# Patient Record
Sex: Female | Born: 1965 | Race: Black or African American | Hispanic: No | Marital: Single | Smoking: Never smoker
Health system: Southern US, Community
[De-identification: ages and names within clinical notes are randomized; demographics above are authoritative.]

## PROBLEM LIST (undated history)

## (undated) DIAGNOSIS — F32A Depression, unspecified: Secondary | ICD-10-CM

## (undated) DIAGNOSIS — F419 Anxiety disorder, unspecified: Secondary | ICD-10-CM

## (undated) DIAGNOSIS — E039 Hypothyroidism, unspecified: Secondary | ICD-10-CM

## (undated) DIAGNOSIS — J45909 Unspecified asthma, uncomplicated: Secondary | ICD-10-CM

## (undated) DIAGNOSIS — R112 Nausea with vomiting, unspecified: Secondary | ICD-10-CM

## (undated) DIAGNOSIS — Z8614 Personal history of Methicillin resistant Staphylococcus aureus infection: Secondary | ICD-10-CM

## (undated) DIAGNOSIS — I10 Essential (primary) hypertension: Secondary | ICD-10-CM

## (undated) DIAGNOSIS — F329 Major depressive disorder, single episode, unspecified: Secondary | ICD-10-CM

## (undated) DIAGNOSIS — I499 Cardiac arrhythmia, unspecified: Secondary | ICD-10-CM

## (undated) DIAGNOSIS — R0600 Dyspnea, unspecified: Secondary | ICD-10-CM

## (undated) DIAGNOSIS — M549 Dorsalgia, unspecified: Secondary | ICD-10-CM

## (undated) DIAGNOSIS — Z9889 Other specified postprocedural states: Secondary | ICD-10-CM

## (undated) HISTORY — PX: TUBAL LIGATION: SHX77

## (undated) HISTORY — PX: CYSTECTOMY: SUR359

## (undated) HISTORY — PX: HERNIA REPAIR: SHX51

---

## 2000-01-21 ENCOUNTER — Other Ambulatory Visit: Admission: RE | Admit: 2000-01-21 | Discharge: 2000-01-21 | Payer: Self-pay | Admitting: Obstetrics

## 2000-04-19 ENCOUNTER — Emergency Department (HOSPITAL_COMMUNITY): Admission: EM | Admit: 2000-04-19 | Discharge: 2000-04-19 | Payer: Self-pay | Admitting: Emergency Medicine

## 2000-08-12 ENCOUNTER — Inpatient Hospital Stay (HOSPITAL_COMMUNITY): Admission: AD | Admit: 2000-08-12 | Discharge: 2000-08-12 | Payer: Self-pay | Admitting: Obstetrics

## 2001-08-04 ENCOUNTER — Emergency Department (HOSPITAL_COMMUNITY): Admission: EM | Admit: 2001-08-04 | Discharge: 2001-08-04 | Payer: Self-pay | Admitting: Emergency Medicine

## 2001-08-10 ENCOUNTER — Encounter: Payer: Self-pay | Admitting: *Deleted

## 2001-08-10 ENCOUNTER — Encounter: Admission: RE | Admit: 2001-08-10 | Discharge: 2001-08-10 | Payer: Self-pay | Admitting: *Deleted

## 2005-03-30 ENCOUNTER — Emergency Department (HOSPITAL_COMMUNITY): Admission: EM | Admit: 2005-03-30 | Discharge: 2005-03-30 | Payer: Self-pay | Admitting: Family Medicine

## 2006-01-06 ENCOUNTER — Emergency Department (HOSPITAL_COMMUNITY): Admission: EM | Admit: 2006-01-06 | Discharge: 2006-01-06 | Payer: Self-pay | Admitting: Family Medicine

## 2006-12-29 ENCOUNTER — Encounter: Admission: RE | Admit: 2006-12-29 | Discharge: 2006-12-29 | Payer: Self-pay | Admitting: Family Medicine

## 2008-02-01 ENCOUNTER — Encounter: Admission: RE | Admit: 2008-02-01 | Discharge: 2008-02-01 | Payer: Self-pay | Admitting: Diagnostic Radiology

## 2008-06-28 ENCOUNTER — Ambulatory Visit: Payer: Self-pay | Admitting: Internal Medicine

## 2008-06-28 DIAGNOSIS — L910 Hypertrophic scar: Secondary | ICD-10-CM

## 2008-07-30 ENCOUNTER — Emergency Department (HOSPITAL_COMMUNITY): Admission: EM | Admit: 2008-07-30 | Discharge: 2008-07-30 | Payer: Self-pay | Admitting: Family Medicine

## 2008-08-06 ENCOUNTER — Emergency Department (HOSPITAL_COMMUNITY): Admission: EM | Admit: 2008-08-06 | Discharge: 2008-08-06 | Payer: Self-pay | Admitting: Family Medicine

## 2008-08-17 ENCOUNTER — Ambulatory Visit: Payer: Self-pay | Admitting: Internal Medicine

## 2008-08-17 ENCOUNTER — Other Ambulatory Visit: Admission: RE | Admit: 2008-08-17 | Discharge: 2008-08-17 | Payer: Self-pay | Admitting: Internal Medicine

## 2008-08-17 ENCOUNTER — Encounter (INDEPENDENT_AMBULATORY_CARE_PROVIDER_SITE_OTHER): Payer: Self-pay | Admitting: Internal Medicine

## 2008-08-17 DIAGNOSIS — F319 Bipolar disorder, unspecified: Secondary | ICD-10-CM

## 2008-08-17 DIAGNOSIS — L03119 Cellulitis of unspecified part of limb: Secondary | ICD-10-CM

## 2008-08-17 DIAGNOSIS — L02419 Cutaneous abscess of limb, unspecified: Secondary | ICD-10-CM | POA: Insufficient documentation

## 2008-08-17 DIAGNOSIS — E039 Hypothyroidism, unspecified: Secondary | ICD-10-CM | POA: Insufficient documentation

## 2008-08-17 LAB — CONVERTED CEMR LAB
Bilirubin Urine: NEGATIVE
KOH Prep: NEGATIVE
Ketones, urine, test strip: NEGATIVE
Specific Gravity, Urine: 1.015
Whiff Test: NEGATIVE
pH: 6

## 2008-08-23 ENCOUNTER — Ambulatory Visit: Payer: Self-pay | Admitting: Internal Medicine

## 2008-08-29 DIAGNOSIS — D649 Anemia, unspecified: Secondary | ICD-10-CM

## 2008-08-29 LAB — CONVERTED CEMR LAB
AST: 13 units/L (ref 0–37)
Albumin: 4.1 g/dL (ref 3.5–5.2)
Alkaline Phosphatase: 86 units/L (ref 39–117)
BUN: 14 mg/dL (ref 6–23)
Basophils Relative: 0 % (ref 0–1)
Calcium: 9.5 mg/dL (ref 8.4–10.5)
Chlamydia, DNA Probe: NEGATIVE
Chloride: 103 meq/L (ref 96–112)
HDL: 43 mg/dL (ref >39)
LDL Cholesterol: 76 mg/dL (ref 0–99)
Lymphs Abs: 2.7 10*3/uL (ref 0.7–4.0)
MCHC: 32.7 g/dL (ref 30.0–36.0)
Monocytes Relative: 6 % (ref 3–12)
Neutro Abs: 6 10*3/uL (ref 1.7–7.7)
Neutrophils Relative %: 64 % (ref 43–77)
Platelets: 399 10*3/uL (ref 150–400)
Potassium: 4.6 meq/L (ref 3.5–5.3)
RBC: 4.79 M/uL (ref 3.87–5.11)
Sodium: 141 meq/L (ref 135–145)
TSH: 1.126 microintl units/mL (ref 0.350–4.50)
Total Protein: 8 g/dL (ref 6.0–8.3)
WBC: 9.4 10*3/uL (ref 4.0–10.5)

## 2008-09-06 ENCOUNTER — Encounter: Payer: Self-pay | Admitting: Obstetrics & Gynecology

## 2008-09-06 ENCOUNTER — Ambulatory Visit (HOSPITAL_COMMUNITY): Admission: RE | Admit: 2008-09-06 | Discharge: 2008-09-06 | Payer: Self-pay | Admitting: Obstetrics & Gynecology

## 2008-09-07 ENCOUNTER — Encounter (INDEPENDENT_AMBULATORY_CARE_PROVIDER_SITE_OTHER): Payer: Self-pay | Admitting: *Deleted

## 2009-08-08 ENCOUNTER — Encounter: Admission: RE | Admit: 2009-08-08 | Discharge: 2009-08-08 | Payer: Self-pay | Admitting: Family Medicine

## 2009-09-06 ENCOUNTER — Emergency Department (HOSPITAL_COMMUNITY): Admission: EM | Admit: 2009-09-06 | Discharge: 2009-09-06 | Payer: Self-pay | Admitting: Emergency Medicine

## 2009-09-08 ENCOUNTER — Encounter: Payer: Self-pay | Admitting: Pulmonary Disease

## 2009-12-02 ENCOUNTER — Ambulatory Visit: Payer: Self-pay | Admitting: Pulmonary Disease

## 2009-12-02 DIAGNOSIS — R0602 Shortness of breath: Secondary | ICD-10-CM

## 2011-01-12 ENCOUNTER — Encounter: Payer: Self-pay | Admitting: Family Medicine

## 2011-03-05 ENCOUNTER — Other Ambulatory Visit: Payer: Self-pay | Admitting: Family Medicine

## 2011-03-05 DIAGNOSIS — Z1231 Encounter for screening mammogram for malignant neoplasm of breast: Secondary | ICD-10-CM

## 2011-03-24 ENCOUNTER — Emergency Department (HOSPITAL_COMMUNITY): Payer: Medicare Other

## 2011-03-24 ENCOUNTER — Emergency Department (HOSPITAL_COMMUNITY)
Admission: EM | Admit: 2011-03-24 | Discharge: 2011-03-24 | Disposition: A | Discharge status: Home / Self Care | Payer: Medicare Other | Attending: Emergency Medicine | Admitting: Emergency Medicine

## 2011-03-24 DIAGNOSIS — M1710 Unilateral primary osteoarthritis, unspecified knee: Secondary | ICD-10-CM | POA: Insufficient documentation

## 2011-03-24 DIAGNOSIS — M25569 Pain in unspecified knee: Secondary | ICD-10-CM | POA: Insufficient documentation

## 2011-03-24 DIAGNOSIS — IMO0002 Reserved for concepts with insufficient information to code with codable children: Secondary | ICD-10-CM | POA: Insufficient documentation

## 2011-03-27 LAB — DIFFERENTIAL
Eosinophils Absolute: 0.1 10*3/uL (ref 0.0–0.7)
Eosinophils Relative: 1 % (ref 0–5)
Lymphocytes Relative: 27 % (ref 12–46)
Lymphs Abs: 3.7 10*3/uL (ref 0.7–4.0)
Monocytes Relative: 5 % (ref 3–12)

## 2011-03-27 LAB — POCT I-STAT, CHEM 8
BUN: 18 mg/dL (ref 6–23)
Creatinine, Ser: 1 mg/dL (ref 0.4–1.2)
Glucose, Bld: 119 mg/dL — ABNORMAL HIGH (ref 70–99)
Hemoglobin: 12.9 g/dL (ref 12.0–15.0)
Sodium: 138 mEq/L (ref 135–145)
TCO2: 27 mmol/L (ref 0–100)

## 2011-03-27 LAB — CBC
HCT: 35.9 % — ABNORMAL LOW (ref 36.0–46.0)
Hemoglobin: 11.6 g/dL — ABNORMAL LOW (ref 12.0–15.0)
MCV: 76.7 fL — ABNORMAL LOW (ref 78.0–100.0)
RBC: 4.68 MIL/uL (ref 3.87–5.11)
WBC: 13.7 10*3/uL — ABNORMAL HIGH (ref 4.0–10.5)

## 2011-03-27 LAB — BASIC METABOLIC PANEL
BUN: 15 mg/dL (ref 6–23)
Chloride: 102 mEq/L (ref 96–112)
GFR calc Af Amer: >60 mL/min (ref >60)
GFR calc non Af Amer: 55 mL/min — ABNORMAL LOW (ref >60)
Potassium: 4.1 mEq/L (ref 3.5–5.1)
Sodium: 138 mEq/L (ref 135–145)

## 2011-04-15 ENCOUNTER — Ambulatory Visit: Payer: Self-pay

## 2011-04-30 ENCOUNTER — Ambulatory Visit: Payer: Self-pay

## 2011-05-05 NOTE — Op Note (Signed)
Suzanne Jacobs, Suzanne Jacobs               ACCOUNT NO.:  [id number]   MEDICAL RECORD NO.:  [id number]          PATIENT TYPE:  AMB   LOCATION:  [facility]                           [facility]ACILITY:  [facility]   PHYSICIAN:  Roseanna Rainbow, M.D.DATE OF BIRTH:  [birth year]   DATE OF PROCEDURE:  DATE OF DISCHARGE:                               OPERATIVE REPORT   PREOPERATIVE DIAGNOSIS:  Keloid of the vulva.   POSTOPERATIVE DIAGNOSIS:  Keloid of the vulva.   PROCEDURE:  Section of a keloid of the vulva.   SURGEON:  Roseanna Rainbow, MD   ANESTHESIA:  Laryngeal mask airway, local.   FINDINGS:  There was a 2 cm x 1 cm hypertrophic scar in the midline of  the mons pubis skin.   ESTIMATED BLOOD LOSS:  Minimal.   COMPLICATIONS:  None.   PROCEDURE:  The patient was taken to the operating room with an IV  running.  A laryngeal mask airway was placed.  She was placed in the  dorsal lithotomy position and prepped and draped in the usual sterile  fashion.  After time-out had been completed, the skin surrounding the  scar was incised and the keloid was excised using the Bovie on a cutting  setting.  Individual bleeding points in the subcutaneous tissue were  cauterized with Bovie.  The defect was then irrigated.  The incision was  then repaired in layers using interrupted sutures of 3-0 Vicryl in the  subcutaneous layers.  The skin was closed in a subcuticular fashion  again using running suture of 3-0 Vicryl.  Dermabond was applied.  Please note that prior to the closure of the defect, was infiltrated  with a Kenalog 0.25% Marcaine mixture.  At the close of the procedure,  the instrument and pack counts were said to be correct x2.  The patient  was awakened from anesthesia and taken to the PACU, awake and in stable  condition.      Roseanna Rainbow, M.D.  Electronically Signed     LAJ/MEDQ  D:  09/06/2008  T:  09/06/2008  Job:  [id number]

## 2011-05-20 ENCOUNTER — Encounter (HOSPITAL_COMMUNITY): Payer: Medicare Other

## 2011-05-20 ENCOUNTER — Other Ambulatory Visit: Payer: Self-pay | Admitting: Obstetrics & Gynecology

## 2011-05-20 LAB — BASIC METABOLIC PANEL
BUN: 14 mg/dL (ref 6–23)
CO2: 24 mEq/L (ref 19–32)
Calcium: 9.7 mg/dL (ref 8.4–10.5)
Chloride: 99 mEq/L (ref 96–112)
Creatinine, Ser: 0.89 mg/dL (ref 0.4–1.2)
GFR calc Af Amer: >60 mL/min (ref >60)

## 2011-05-20 LAB — CBC
Hemoglobin: 11.3 g/dL — ABNORMAL LOW (ref 12.0–15.0)
MCH: 24.2 pg — ABNORMAL LOW (ref 26.0–34.0)
MCHC: 33.4 g/dL (ref 30.0–36.0)
MCV: 72.4 fL — ABNORMAL LOW (ref 78.0–100.0)
Platelets: 260 10*3/uL (ref 150–400)
RBC: 4.67 MIL/uL (ref 3.87–5.11)

## 2011-05-22 ENCOUNTER — Ambulatory Visit (HOSPITAL_COMMUNITY)
Admission: RE | Admit: 2011-05-22 | Discharge: 2011-05-22 | Disposition: A | Discharge status: Home / Self Care | Payer: Medicare Other | Source: Ambulatory Visit | Attending: Obstetrics & Gynecology | Admitting: Obstetrics & Gynecology

## 2011-05-22 ENCOUNTER — Other Ambulatory Visit: Payer: Self-pay | Admitting: Obstetrics & Gynecology

## 2011-05-22 DIAGNOSIS — Z01812 Encounter for preprocedural laboratory examination: Secondary | ICD-10-CM | POA: Insufficient documentation

## 2011-05-22 DIAGNOSIS — L910 Hypertrophic scar: Secondary | ICD-10-CM | POA: Insufficient documentation

## 2011-05-22 DIAGNOSIS — Z01818 Encounter for other preprocedural examination: Secondary | ICD-10-CM | POA: Insufficient documentation

## 2011-05-29 NOTE — Op Note (Signed)
  NAMESILAS, Suzanne Jacobs               ACCOUNT NO.:  [id number]  MEDICAL RECORD NO.:  [id number]           PATIENT TYPE:  O  LOCATION:  [facility]                          [facility]  PHYSICIAN:  Roseanna Rainbow, M.D.DATE OF BIRTH:  January 26, 1966  DATE OF PROCEDURE:  05/22/2011 DATE OF DISCHARGE:                              OPERATIVE REPORT   PREOPERATIVE DIAGNOSIS:  Keloid of the vulva, mons pubis skin.  POSTOPERATIVE DIAGNOSIS:  Keloid of the vulva, mons pubis skin.  PROCEDURE:  Excision of keloid from the mons pubis.  SURGEON:  Roseanna Rainbow, MD  ANESTHESIA:  Local, laryngeal mask airway.  FINDINGS:  Keloid approximately 2 cm in diameter involving the skin of the mons pubis in the midline.  SPECIMEN:  Hypertrophic scar, keloid.  ESTIMATED BLOOD LOSS:  Minimal.  COMPLICATIONS:  None.  PROCEDURE:  The patient was taken to the operating room with an IV running.  A laryngeal mask airway was placed.  She was then placed in the semilithotomy position in Hatch stirrups and prepped and draped in the usual sterile fashion.  After time-out had been completed, the above- noted lesion was circumscribed with the scalpel.  The scar was then excised using the Bovie.  The defect was then reapproximated in several layers.  Two deeper running layers of 2-0 Vicryl suture were placed in the subcutaneous fat layer.  The subcuticular layer and the skin was reapproximated with vertical mattress sutures using 3-0 Vicryl.  Please note that prior to the incision, the scar was infiltrated with 10 mL of 1% lidocaine.  Prior to the closure, the subcutaneous layer was infiltrated with Kenalog, Marcaine mixture.  The incision was irrigated. Adequate hemostasis was noted.  At the close of the procedure, the instrument and pack counts were said to be correct x2.  The patient was taken to the PACU awake and in stable condition.     Roseanna Rainbow, M.D.    Judee Clara  D:  05/22/2011   T:  05/23/2011  Job:  [id number]  Electronically Signed by Antionette Char M.D. on 05/29/2011 01:24:06 PM

## 2011-07-16 ENCOUNTER — Ambulatory Visit
Admission: RE | Admit: 2011-07-16 | Discharge: 2011-07-16 | Disposition: A | Discharge status: Home / Self Care | Payer: Medicare Other | Source: Ambulatory Visit | Attending: Family Medicine | Admitting: Family Medicine

## 2011-07-16 DIAGNOSIS — Z1231 Encounter for screening mammogram for malignant neoplasm of breast: Secondary | ICD-10-CM

## 2011-09-18 LAB — CBC
HCT: 35.2 — ABNORMAL LOW
Hemoglobin: 11.3 — ABNORMAL LOW
MCHC: 32.1
Platelets: 351
RDW: 17.4 — ABNORMAL HIGH

## 2011-09-18 LAB — CULTURE, ROUTINE-ABSCESS

## 2011-09-20 ENCOUNTER — Emergency Department (HOSPITAL_COMMUNITY): Payer: Medicare Other

## 2011-09-20 ENCOUNTER — Emergency Department (HOSPITAL_COMMUNITY)
Admission: EM | Admit: 2011-09-20 | Discharge: 2011-09-20 | Disposition: A | Discharge status: Home / Self Care | Payer: Medicare Other | Attending: Emergency Medicine | Admitting: Emergency Medicine

## 2011-09-20 DIAGNOSIS — I10 Essential (primary) hypertension: Secondary | ICD-10-CM | POA: Insufficient documentation

## 2011-09-20 DIAGNOSIS — Z79899 Other long term (current) drug therapy: Secondary | ICD-10-CM | POA: Insufficient documentation

## 2011-09-20 DIAGNOSIS — K219 Gastro-esophageal reflux disease without esophagitis: Secondary | ICD-10-CM | POA: Insufficient documentation

## 2011-09-20 DIAGNOSIS — R05 Cough: Secondary | ICD-10-CM | POA: Insufficient documentation

## 2011-09-20 DIAGNOSIS — F319 Bipolar disorder, unspecified: Secondary | ICD-10-CM | POA: Insufficient documentation

## 2011-09-20 DIAGNOSIS — J309 Allergic rhinitis, unspecified: Secondary | ICD-10-CM | POA: Insufficient documentation

## 2011-09-20 DIAGNOSIS — F79 Unspecified intellectual disabilities: Secondary | ICD-10-CM | POA: Insufficient documentation

## 2011-09-20 DIAGNOSIS — R509 Fever, unspecified: Secondary | ICD-10-CM | POA: Insufficient documentation

## 2011-09-20 DIAGNOSIS — E039 Hypothyroidism, unspecified: Secondary | ICD-10-CM | POA: Insufficient documentation

## 2011-09-20 DIAGNOSIS — R059 Cough, unspecified: Secondary | ICD-10-CM | POA: Insufficient documentation

## 2011-09-21 LAB — CBC
HCT: 37.3
Hemoglobin: 11.9 — ABNORMAL LOW
RBC: 4.95
WBC: 13.7 — ABNORMAL HIGH

## 2012-02-24 ENCOUNTER — Encounter (HOSPITAL_COMMUNITY): Payer: Self-pay | Admitting: *Deleted

## 2012-02-24 ENCOUNTER — Emergency Department (HOSPITAL_COMMUNITY)
Admission: EM | Admit: 2012-02-24 | Discharge: 2012-02-24 | Disposition: A | Discharge status: Home / Self Care | Payer: Medicare Other | Attending: Emergency Medicine | Admitting: Emergency Medicine

## 2012-02-24 DIAGNOSIS — R109 Unspecified abdominal pain: Secondary | ICD-10-CM | POA: Insufficient documentation

## 2012-02-24 DIAGNOSIS — L293 Anogenital pruritus, unspecified: Secondary | ICD-10-CM | POA: Insufficient documentation

## 2012-02-24 DIAGNOSIS — I10 Essential (primary) hypertension: Secondary | ICD-10-CM | POA: Insufficient documentation

## 2012-02-24 DIAGNOSIS — N390 Urinary tract infection, site not specified: Secondary | ICD-10-CM | POA: Insufficient documentation

## 2012-02-24 DIAGNOSIS — R300 Dysuria: Secondary | ICD-10-CM | POA: Insufficient documentation

## 2012-02-24 HISTORY — DX: Essential (primary) hypertension: I10

## 2012-02-24 LAB — WET PREP, GENITAL
Clue Cells Wet Prep HPF POC: NONE SEEN
Trich, Wet Prep: NONE SEEN
Yeast Wet Prep HPF POC: NONE SEEN

## 2012-02-24 LAB — PREGNANCY, URINE: Preg Test, Ur: NEGATIVE

## 2012-02-24 LAB — URINALYSIS, ROUTINE W REFLEX MICROSCOPIC
Glucose, UA: NEGATIVE mg/dL
Protein, ur: NEGATIVE mg/dL
Specific Gravity, Urine: 1.016 (ref 1.005–1.030)
pH: 6 (ref 5.0–8.0)

## 2012-02-24 LAB — POCT PREGNANCY, URINE: Preg Test, Ur: NEGATIVE

## 2012-02-24 MED ORDER — CEPHALEXIN 500 MG PO CAPS: 500.0000 mg | ORAL_CAPSULE | Freq: Three times a day (TID) | ORAL | Status: AC

## 2012-02-24 NOTE — ED Provider Notes (Signed)
Medical screening examination/treatment/procedure(s) were performed by non-physician practitioner and as supervising physician I was immediately available for consultation/collaboration.   Glynn Octave, MD 02/24/12 [phone]

## 2012-02-24 NOTE — Discharge Instructions (Signed)
Urinary Tract Infection Infections of the urinary tract can start in several places. A bladder infection (cystitis), a kidney infection (pyelonephritis), and a prostate infection (prostatitis) are different types of urinary tract infections (UTIs). They usually get better if treated with medicines (antibiotics) that kill germs. Take all the medicine until it is gone. You or your child may feel better in a few days, but TAKE ALL MEDICINE or the infection may not respond and may become more difficult to treat. HOME CARE INSTRUCTIONS   Drink enough water and fluids to keep the urine clear or pale yellow. Cranberry juice is especially recommended, in addition to large amounts of water.   Avoid caffeine, tea, and carbonated beverages. They tend to irritate the bladder.   Alcohol may irritate the prostate.   Only take over-the-counter or prescription medicines for pain, discomfort, or fever as directed by your caregiver.  To prevent further infections:  Empty the bladder often. Avoid holding urine for long periods of time.   After a bowel movement, women should cleanse from front to back. Use each tissue only once.   Empty the bladder before and after sexual intercourse.  FINDING OUT THE RESULTS OF YOUR TEST Not all test results are available during your visit. If your or your child's test results are not back during the visit, make an appointment with your caregiver to find out the results. Do not assume everything is normal if you have not heard from your caregiver or the medical facility. It is important for you to follow up on all test results. SEEK MEDICAL CARE IF:   There is back pain.   Your baby is older than 3 months with a rectal temperature of 100.5 F (38.1 C) or higher for more than 1 day.   Your or your child's problems (symptoms) are no better in 3 days. Return sooner if you or your child is getting worse.  SEEK IMMEDIATE MEDICAL CARE IF:   There is severe back pain or lower  abdominal pain.   You or your child develops chills.   You have a fever.   Your baby is older than 3 months with a rectal temperature of 102 F (38.9 C) or higher.   Your baby is 86 months old or younger with a rectal temperature of 100.4 F (38 C) or higher.   There is nausea or vomiting.   There is continued burning or discomfort with urination.  MAKE SURE YOU:   Understand these instructions.   Will watch your condition.   Will get help right away if you are not doing well or get worse.  Document Released: 09/16/2005 Document Revised: 11/26/2011 Document Reviewed: 04/21/2007 [facility] Patient Information 2012 [place], [place].       If you have no primary doctor, here are some resources that may be helpful:  Medicaid-accepting [facility] Providers:   - [facility]- [address]      [phone]      Mon-Fri 9am-7pm, Sat 9am-1pm   - [facility]- [address] [place]      [phone]   - [facility]- [address], [address] [place]      [phone]   [facility]- [address]      [phone]   - Renaye Rakers- [address] [place], [address]      [phone]      Only accepts [place] patients  after they have her name applied to their card   Self Pay (no insurance) in [facility]:   - Sickle Cell Patients: Dr Willey Blade, [facility]      [address] [place]      [phone]   - [facility] [phone]   - Physician Referral Service- [phone]   - [facility]- [address] [place]      [phone]   [facility] [place]- [address]   - [facility]- see information above      (Speak to [employer] if you do not have insurance)   - [facility]- [address] [place]      [phone]   - [facility] [place]- [address]      [phone]   - [facility]- [address]      [phone]   - Dr Julio Sicks-  [address], [place]      [phone]   - [facility]- [address]      [phone]   - [facility]- [address]      [phone]      Also [address]      [phone]   - [facility]- [address]      [phone]      1st and 3rd Saturday every month, 10am-1pm Other agencies that provide inexpensive medical care:    [facility]  [phone]    [facility]  [phone]    [facility]  [phone]    [facility]  [phone]    [facility]  [phone]  General Information: Finding a doctor when you do not have health insurance can be tricky. Although you are not limited by an insurance plan, you are of course limited by her finances and how much but he can pay out of pocket.  What are your options if you don't have health insurance?   1) Find a Librarian, academic and Pay Out of Pocket Although you won't have to find out who is covered by your insurance plan, it is a good idea to ask around and get recommendations. You will then need to call the office and see if the doctor you have chosen will accept you as a new patient and what types of options they offer for patients who are self-pay. Some doctors offer discounts or will set up payment plans for their patients who do not have insurance, but you will need to ask so you aren't surprised when you get to your appointment.  2) Contact Your Local Health Department Not all health departments have doctors that can see patients for sick visits, but many do, so it is worth a call to see if yours does. If you don't know where your local health department is, you can check in your phone book. The CDC also has a tool to help you locate your state's health department, and many state websites also have listings of all of their local health departments.  3) Find a Walk-in Clinic If your illness is not likely to be  very severe or complicated, you may want to try a walk in clinic. These are popping up all over the country in pharmacies, drugstores, and shopping centers. They're usually staffed by nurse practitioners or physician assistants that have been trained to treat  common illnesses and complaints. They're usually fairly quick and inexpensive. However, if you have serious medical issues or chronic medical problems, these are probably not your best option

## 2012-02-24 NOTE — ED Provider Notes (Signed)
History     CSN: [id number]  Arrival date & time 02/24/12  1623   First MD Initiated Contact with Patient 02/24/12 1713      Chief Complaint  Patient presents with  . Dysuria  . Abdominal Pain    (Consider location/radiation/quality/duration/timing/severity/associated sxs/prior treatment) The history is provided by the patient.  46 y/o F with hx HTN presents to ED with c/c of dysuria and lower abd pain x several days. Also with c/o vulvar itching in same timeframe. Abd pain is crampy, located in mid and bilateral lower abd, intermittent, mild, non-radiating. There is no assoc fever, chills, N/V/D/constipation, hematuria, vaginal discharge or pain. Denies sexual activity. Nothing makes the symptoms better or worse. No prior tx.  Past Medical History  Diagnosis Date  . Hypertension     Past Surgical History  Procedure Date  . Hernia repair     No family history on file.  History  Substance Use Topics  . Smoking status: Never Smoker   . Smokeless tobacco: Not on file  . Alcohol Use: No     Review of Systems 10 systems reviewed and are negative for acute change except as noted in the HPI.  Allergies  Sulfamethoxazole w/trimethoprim  Home Medications  No current outpatient prescriptions on file.  BP 105/73  Pulse 91  Temp(Src) 98 F (36.7 C) (Oral)  Resp 18  Wt 270 lb 12.8 oz (122.834 kg)  SpO2 100%  LMP 01/23/2012  Physical Exam  Constitutional: She is oriented to person, place, and time. She appears well-developed and well-nourished. No distress.  HENT:  Head: Normocephalic and atraumatic.  Mouth/Throat: Oropharynx is clear and moist.  Eyes: Pupils are equal, round, and reactive to light.  Neck: Normal range of motion. Neck supple.  Cardiovascular: Normal rate and regular rhythm.   Pulmonary/Chest: Effort normal. No respiratory distress.  Abdominal: Soft. Bowel sounds are normal. She exhibits no distension. There is Tenderness: mild suprapubic and  bilateral lower abd tenderness.. There is no rebound and no guarding.       Negative CVA tenderness  Genitourinary: There is no rash, tenderness or lesion on the right labia. There is no rash, tenderness or lesion on the left labia. Cervix exhibits no motion tenderness, no discharge and no friability. Right adnexum displays no mass and no tenderness. Left adnexum displays no mass and no tenderness. No tenderness or bleeding around the vagina. No signs of injury around the vagina. Vaginal discharge: small amt clear/white discharge.       Examination limited by pt body habitus  Musculoskeletal: She exhibits no edema and no tenderness.  Neurological: She is alert and oriented to person, place, and time.  Skin: Skin is warm and dry. No rash noted.    ED Course  Procedures (including critical care time)  Labs Reviewed  URINALYSIS, ROUTINE W REFLEX MICROSCOPIC - Abnormal; Notable for the following:    Leukocytes, UA LARGE (*)    All other components within normal limits  WET PREP, GENITAL - Abnormal; Notable for the following:    WBC, Wet Prep HPF POC FEW (*)    All other components within normal limits  URINE MICROSCOPIC-ADD ON - Abnormal; Notable for the following:    Bacteria, UA MANY (*)    All other components within normal limits  PREGNANCY, URINE  POCT PREGNANCY, URINE  GC/CHLAMYDIA PROBE AMP, GENITAL   No results found.  Dx 1: UTI   MDM  U/a with evidence of UTI. Doubt pyelo- pt afebrile and  non-toxic appearing, no CVA tenderness. No sig vaginal discharge or CMT to suggest PID. Abd benign on re-exam. Will d.c home.       Shaaron Adler, [place] 02/24/12 1911

## 2012-02-24 NOTE — ED Notes (Signed)
Pt states "been having burning & itching when peeing, also having some belly pain"; pt indicates left side abd pain

## 2012-06-22 ENCOUNTER — Encounter (HOSPITAL_COMMUNITY): Payer: Self-pay | Admitting: Emergency Medicine

## 2012-06-22 ENCOUNTER — Emergency Department (HOSPITAL_COMMUNITY)
Admission: EM | Admit: 2012-06-22 | Discharge: 2012-06-22 | Disposition: A | Discharge status: Home / Self Care | Payer: No Typology Code available for payment source | Attending: Emergency Medicine | Admitting: Emergency Medicine

## 2012-06-22 ENCOUNTER — Emergency Department (HOSPITAL_COMMUNITY): Payer: No Typology Code available for payment source

## 2012-06-22 DIAGNOSIS — I10 Essential (primary) hypertension: Secondary | ICD-10-CM | POA: Insufficient documentation

## 2012-06-22 DIAGNOSIS — M25562 Pain in left knee: Secondary | ICD-10-CM

## 2012-06-22 DIAGNOSIS — M25569 Pain in unspecified knee: Secondary | ICD-10-CM | POA: Insufficient documentation

## 2012-06-22 MED ORDER — HYDROCODONE-ACETAMINOPHEN 5-325 MG PO TABS: 1.0000 | ORAL_TABLET | ORAL | Status: AC | PRN

## 2012-06-22 MED ORDER — HYDROCODONE-ACETAMINOPHEN 5-325 MG PO TABS
1.0000 | ORAL_TABLET | Freq: Once | ORAL | Status: AC
  Administered 2012-06-22: 1 via ORAL
  Filled 2012-06-22: qty 1

## 2012-06-22 NOTE — ED Provider Notes (Signed)
Medical screening examination/treatment/procedure(s) were performed by non-physician practitioner and as supervising physician I was immediately available for consultation/collaboration.    Mae Denunzio D Jenavieve Freda, MD 06/22/12 2356 

## 2012-06-22 NOTE — ED Notes (Signed)
Pt fell today at [employer]  Pt states there was liquid dish soap on the floor causing her to slip and fall  Pt is c/o pain to her left knee  Pt has bruising and swelling noted Pt states it hurts to walk on

## 2012-06-22 NOTE — ED Provider Notes (Signed)
History     CSN: [id number]  Arrival date & time 06/22/12  6578   First MD Initiated Contact with Patient 06/22/12 1948      Chief Complaint  Patient presents with  . Fall    (Consider location/radiation/quality/duration/timing/severity/associated sxs/prior treatment) HPI Comments: Patient here with left knee pain s/p slipping on wet dish soap on the floor at [facility] - she states that she went down onto her left knee - reports has been unable to ambulate since the event but is able to pivot and put a small amount of weight on the area - denies numbness, tingling.  Patient is a 46 y.o. female presenting with fall. The history is provided by the patient. No language interpreter was used.  Fall The accident occurred 1 to 2 hours ago. The fall occurred while walking. She fell from a height of 3 to 5 ft. She landed on a hard floor. There was no blood loss. The point of impact was the left knee. The pain is present in the left knee. The pain is at a severity of 10/10. The pain is severe. She was not ambulatory at the scene. There was no entrapment after the fall. There was no drug use involved in the accident. There was no alcohol use involved in the accident. Pertinent negatives include no fever, no abdominal pain, no nausea, no vomiting and no headaches. The symptoms are aggravated by activity, standing and ambulation. She has tried nothing for the symptoms. The treatment provided no relief.    Past Medical History  Diagnosis Date  . Hypertension     Past Surgical History  Procedure Date  . Hernia repair     Family History  Problem Relation Age of Onset  . Cancer Other     History  Substance Use Topics  . Smoking status: Never Smoker   . Smokeless tobacco: Not on file  . Alcohol Use: No    OB History    Grav Para Term Preterm Abortions TAB SAB Ect Mult Living                  Review of Systems  Constitutional: Negative for fever and chills.  HENT: Negative for  neck pain.   Eyes: Negative for pain.  Respiratory: Negative for chest tightness and shortness of breath.   Cardiovascular: Negative for chest pain.  Gastrointestinal: Negative for nausea, vomiting and abdominal pain.  Genitourinary: Negative for dysuria.  Musculoskeletal: Positive for joint swelling, arthralgias and gait problem.  Skin: Negative for wound.  Neurological: Negative for headaches.  All other systems reviewed and are negative.    Allergies  Sulfamethoxazole w-trimethoprim  Home Medications  No current outpatient prescriptions on file.  BP 135/71  Pulse 86  Temp 98 F (36.7 C) (Oral)  Resp 18  SpO2 99%  LMP 06/22/2012  Physical Exam  Nursing note and vitals reviewed. Constitutional: She is oriented to person, place, and time. She appears well-developed and well-nourished. No distress.  HENT:  Head: Normocephalic and atraumatic.  Right Ear: External ear normal.  Left Ear: External ear normal.  Nose: Nose normal.  Mouth/Throat: Oropharynx is clear and moist. No oropharyngeal exudate.  Eyes: Conjunctivae are normal. Pupils are equal, round, and reactive to light. No scleral icterus.  Neck: Normal range of motion. Neck supple.  Cardiovascular: Normal rate, regular rhythm and normal heart sounds.  Exam reveals no gallop and no friction rub.   No murmur heard. Pulmonary/Chest: Effort normal and breath sounds normal. No  respiratory distress. She has no wheezes. She has no rales. She exhibits no tenderness.  Abdominal: Soft. Bowel sounds are normal. She exhibits no distension. There is no tenderness.  Musculoskeletal:       Left knee: She exhibits decreased range of motion, swelling and ecchymosis. She exhibits normal alignment, no LCL laxity and no MCL laxity. tenderness found. Medial joint line, lateral joint line and patellar tendon tenderness noted.  Lymphadenopathy:    She has no cervical adenopathy.  Neurological: She is alert and oriented to person, place,  and time. No cranial nerve deficit. She exhibits normal muscle tone. Coordination normal.  Skin: Skin is warm and dry. No rash noted. No erythema. No pallor.  Psychiatric: She has a normal mood and affect. Her behavior is normal. Judgment and thought content normal.    ED Course  Procedures (including critical care time)  Labs Reviewed - No data to display Dg Knee Complete 4 Views Left  06/22/2012  *RADIOLOGY REPORT*  Clinical Data: Fall, anterior knee pain.  LEFT KNEE - COMPLETE 4+ VIEW  Comparison: 03/24/2011  Findings: Moderate degenerative changes in the left knee, most pronounced in the medial and patellofemoral compartments. No acute bony abnormality.  Specifically, no fracture, subluxation, or dislocation.  Soft tissues are intact.  No joint effusion.  IMPRESSION: No acute bony abnormality.  Original Report Authenticated By: Cyndie Chime, M.D.     Left knee pain   MDM  Patient here with left knee pain s/p fall - x-ray without acute findings, patient is morbidly obese so will place in ace wrap - and pain medication - will follow up with Dr. Luiz Blare.        Suzanne Jacobs, [place] 06/22/12 2131

## 2012-08-02 ENCOUNTER — Emergency Department (HOSPITAL_COMMUNITY)
Admission: EM | Admit: 2012-08-02 | Discharge: 2012-08-02 | Disposition: A | Discharge status: Home / Self Care | Payer: No Typology Code available for payment source | Attending: Emergency Medicine | Admitting: Emergency Medicine

## 2012-08-02 DIAGNOSIS — I10 Essential (primary) hypertension: Secondary | ICD-10-CM | POA: Insufficient documentation

## 2012-08-02 DIAGNOSIS — S8990XA Unspecified injury of unspecified lower leg, initial encounter: Secondary | ICD-10-CM | POA: Insufficient documentation

## 2012-08-02 DIAGNOSIS — W010XXA Fall on same level from slipping, tripping and stumbling without subsequent striking against object, initial encounter: Secondary | ICD-10-CM | POA: Insufficient documentation

## 2012-08-02 DIAGNOSIS — S8992XA Unspecified injury of left lower leg, initial encounter: Secondary | ICD-10-CM

## 2012-08-02 MED ORDER — OXYCODONE-ACETAMINOPHEN 5-325 MG PO TABS
2.0000 | ORAL_TABLET | Freq: Once | ORAL | Status: AC
  Administered 2012-08-02: 2 via ORAL
  Filled 2012-08-02: qty 2

## 2012-08-02 NOTE — ED Provider Notes (Signed)
Medical screening examination/treatment/procedure(s) were performed by non-physician practitioner and as supervising physician I was immediately available for consultation/collaboration.  Kada Friesen T Carden Teel, MD 08/02/12 2342 

## 2012-08-02 NOTE — ED Provider Notes (Signed)
History     CSN: [id number]  Arrival date & time 08/02/12  1840   First MD Initiated Contact with Patient 08/02/12 1928      Chief Complaint  Patient presents with  . Knee Pain    (Consider location/radiation/quality/duration/timing/severity/associated sxs/prior treatment) HPI  45 year old female who reportedly fell at a store when slipping on wet floor.  Pt was seen in ER for evaluation at that time.  Has xray of L knee that shows no acute fx or dislocation.  Was given hydrocodone for pain and referral to orthopedic, Dr. Luiz Blare.  Pt did f/u at [facility].  Pt was given etodolac 400mg  for pain.  Pt sts she has been taking her hydrocodone for pain which helps but her medication has ran out and now the pain return.  Has not taking Etodolac as prescribed.  Pt has f/u appointment in 2 weeks but decided to come here due to worsening of L knee and ankle pain.  Described pain as sharp sensation worsen with walking and improves with rest, non radiating.  No fever, rash, or bleeding.   Past Medical History  Diagnosis Date  . Hypertension     Past Surgical History  Procedure Date  . Hernia repair     Family History  Problem Relation Age of Onset  . Cancer Other     History  Substance Use Topics  . Smoking status: Never Smoker   . Smokeless tobacco: Not on file  . Alcohol Use: No    OB History    Grav Para Term Preterm Abortions TAB SAB Ect Mult Living                  Review of Systems  All other systems reviewed and are negative.    Allergies  Sulfamethoxazole w-trimethoprim  Home Medications  No current outpatient prescriptions on file.  BP 103/77  Pulse 89  Temp 98.9 F (37.2 C) (Oral)  Resp 17  SpO2 98%  Physical Exam  Nursing note and vitals reviewed. Constitutional: She is oriented to person, place, and time. She appears well-developed and well-nourished. No distress.       Morbidly obese  HENT:  Head: Atraumatic.  Eyes: Conjunctivae are  normal.  Neck: Neck supple.  Musculoskeletal:       L lower extremity with generalized ttp throughout without deformity, or swelling noted.  L knee with decreased ROM due to pain.  L ankle with normal ROM.  Pedal pulse 2+.    Neurological: She is alert and oriented to person, place, and time.  Skin: Skin is warm. No rash noted.  Psychiatric: She has a normal mood and affect.    ED Course  Procedures (including critical care time)  Labs Reviewed - No data to display No results found.    1. L knee injury  MDM  L knee injury 1 month ago.  No significant finding on today's exam.  Pt has been prescribed pain medication through her orthopedic office and also has f/u appointment.  Plan to give pt pain management here.  Recommend continue Etodolac as previously prescribed.  Refer to ortho.  Pt voice understanding and agrees with plan.    BP 103/77  Pulse 89  Temp 98.9 F (37.2 C) (Oral)  Resp 17  SpO2 98%       Fayrene Helper, PA-C 08/02/12 2003

## 2012-08-02 NOTE — ED Notes (Signed)
Pt fell in the store on 7/3 and has been having pain in her L leg ever since. Pt c/o pain in L knee and ankle. Pt has been taking Hydrocodone for pain. Pt in wheelchair at triage.

## 2012-08-08 ENCOUNTER — Other Ambulatory Visit (HOSPITAL_COMMUNITY): Payer: Self-pay | Admitting: Sports Medicine

## 2012-08-08 ENCOUNTER — Other Ambulatory Visit: Payer: Self-pay | Admitting: Sports Medicine

## 2012-08-08 DIAGNOSIS — M79606 Pain in leg, unspecified: Secondary | ICD-10-CM

## 2012-08-08 DIAGNOSIS — M545 Low back pain: Secondary | ICD-10-CM

## 2012-08-12 ENCOUNTER — Encounter (HOSPITAL_COMMUNITY): Payer: Medicare Other

## 2012-08-12 ENCOUNTER — Other Ambulatory Visit (HOSPITAL_COMMUNITY): Payer: Medicare Other

## 2012-08-18 ENCOUNTER — Ambulatory Visit
Admission: RE | Admit: 2012-08-18 | Discharge: 2012-08-18 | Disposition: A | Discharge status: Home / Self Care | Payer: Medicare Other | Source: Ambulatory Visit | Attending: Sports Medicine | Admitting: Sports Medicine

## 2012-08-18 DIAGNOSIS — M545 Low back pain: Secondary | ICD-10-CM

## 2012-09-06 ENCOUNTER — Other Ambulatory Visit (HOSPITAL_COMMUNITY): Payer: Medicare Other

## 2012-09-06 ENCOUNTER — Encounter (HOSPITAL_COMMUNITY): Payer: Medicare Other

## 2012-09-20 ENCOUNTER — Encounter (HOSPITAL_COMMUNITY): Payer: Medicare Other

## 2012-09-29 ENCOUNTER — Encounter (HOSPITAL_COMMUNITY): Payer: Medicare Other

## 2012-10-07 ENCOUNTER — Encounter (HOSPITAL_COMMUNITY)
Admission: RE | Admit: 2012-10-07 | Discharge: 2012-10-07 | Disposition: A | Discharge status: Home / Self Care | Payer: Medicare Other | Source: Ambulatory Visit | Attending: Sports Medicine | Admitting: Sports Medicine

## 2012-10-07 ENCOUNTER — Encounter (HOSPITAL_COMMUNITY): Payer: Self-pay

## 2012-10-07 DIAGNOSIS — M79606 Pain in leg, unspecified: Secondary | ICD-10-CM

## 2012-10-07 DIAGNOSIS — M79609 Pain in unspecified limb: Secondary | ICD-10-CM | POA: Insufficient documentation

## 2012-10-07 MED ORDER — TECHNETIUM TC 99M MEDRONATE IV KIT
25.0000 | PACK | Freq: Once | INTRAVENOUS | Status: AC | PRN
  Administered 2012-10-07: 25 via INTRAVENOUS

## 2012-11-21 ENCOUNTER — Other Ambulatory Visit: Payer: Self-pay | Admitting: Family Medicine

## 2012-11-21 DIAGNOSIS — Z1231 Encounter for screening mammogram for malignant neoplasm of breast: Secondary | ICD-10-CM

## 2012-12-15 ENCOUNTER — Emergency Department (HOSPITAL_COMMUNITY)
Admission: EM | Admit: 2012-12-15 | Discharge: 2012-12-15 | Disposition: A | Discharge status: Home / Self Care | Payer: Medicare Other | Attending: Emergency Medicine | Admitting: Emergency Medicine

## 2012-12-15 ENCOUNTER — Emergency Department (HOSPITAL_COMMUNITY): Payer: Medicare Other

## 2012-12-15 DIAGNOSIS — R079 Chest pain, unspecified: Secondary | ICD-10-CM | POA: Insufficient documentation

## 2012-12-15 DIAGNOSIS — I10 Essential (primary) hypertension: Secondary | ICD-10-CM | POA: Insufficient documentation

## 2012-12-15 DIAGNOSIS — N390 Urinary tract infection, site not specified: Secondary | ICD-10-CM | POA: Insufficient documentation

## 2012-12-15 DIAGNOSIS — Z79899 Other long term (current) drug therapy: Secondary | ICD-10-CM | POA: Insufficient documentation

## 2012-12-15 DIAGNOSIS — F410 Panic disorder [episodic paroxysmal anxiety] without agoraphobia: Secondary | ICD-10-CM | POA: Insufficient documentation

## 2012-12-15 DIAGNOSIS — Z3202 Encounter for pregnancy test, result negative: Secondary | ICD-10-CM | POA: Insufficient documentation

## 2012-12-15 DIAGNOSIS — F430 Acute stress reaction: Secondary | ICD-10-CM | POA: Insufficient documentation

## 2012-12-15 LAB — URINALYSIS, ROUTINE W REFLEX MICROSCOPIC
Glucose, UA: NEGATIVE mg/dL
Hgb urine dipstick: NEGATIVE
Specific Gravity, Urine: 1.008 (ref 1.005–1.030)
Urobilinogen, UA: 0.2 mg/dL (ref 0.0–1.0)

## 2012-12-15 LAB — POCT I-STAT, CHEM 8
BUN: 16 mg/dL (ref 6–23)
Chloride: 102 mEq/L (ref 96–112)
HCT: 37 % (ref 36.0–46.0)
Sodium: 140 mEq/L (ref 135–145)
TCO2: 29 mmol/L (ref 0–100)

## 2012-12-15 LAB — POCT PREGNANCY, URINE: Preg Test, Ur: NEGATIVE

## 2012-12-15 MED ORDER — LORAZEPAM 2 MG/ML IJ SOLN
1.0000 mg | Freq: Once | INTRAMUSCULAR | Status: AC
  Administered 2012-12-15: 1 mg via INTRAVENOUS
  Filled 2012-12-15: qty 1

## 2012-12-15 MED ORDER — NITROFURANTOIN MONOHYD MACRO 100 MG PO CAPS: 100.0000 mg | ORAL_CAPSULE | Freq: Two times a day (BID) | ORAL | Status: DC

## 2012-12-15 MED ORDER — DEXTROSE 5 % IV SOLN
1.0000 g | Freq: Once | INTRAVENOUS | Status: AC
  Administered 2012-12-15: 1 g via INTRAVENOUS
  Filled 2012-12-15: qty 10

## 2012-12-15 MED ORDER — LORAZEPAM 1 MG PO TABS: 1.0000 mg | ORAL_TABLET | Freq: Three times a day (TID) | ORAL | Status: DC | PRN

## 2012-12-15 NOTE — ED Notes (Signed)
[id number]<AV> Expected date:12/15/12<BR> Expected time: 4:31 PM<BR> Means of arrival:<BR> Comments:<BR> Female from home SOB

## 2012-12-15 NOTE — ED Notes (Signed)
Per EMS pt s/s started last night, having difficulty breathing at times with tightness in her chest. Pt accompanied by boyfriend with EMS, [facility].

## 2012-12-15 NOTE — ED Provider Notes (Addendum)
History     CSN: [id number]  Arrival date & time 12/15/12  1633   First MD Initiated Contact with Patient 12/15/12 1657      Chief Complaint  Patient presents with  . Shortness of Breath    (Consider location/radiation/quality/duration/timing/severity/associated sxs/prior treatment) Patient is a 46 y.o. female presenting with shortness of breath. The history is provided by the patient and the spouse.  Shortness of Breath  The current episode started yesterday. The onset was sudden. The problem occurs frequently. The problem has been gradually worsening. The problem is severe. Nothing relieves the symptoms. Exacerbated by: stress. Associated symptoms include chest pain and shortness of breath. Pertinent negatives include no fever, no cough and no wheezing. Associated symptoms comments: Anxious and feels like panic attack that started after she ran out of her psych meds and did not have any yesterday. Her past medical history does not include asthma or past wheezing. The last void occurred less than 6 hours ago. There were no sick contacts. She has received no recent medical care.    Past Medical History  Diagnosis Date  . Hypertension     Past Surgical History  Procedure Date  . Hernia repair     Family History  Problem Relation Age of Onset  . Cancer Other     History  Substance Use Topics  . Smoking status: Never Smoker   . Smokeless tobacco: Not on file  . Alcohol Use: No    OB History    Grav Para Term Preterm Abortions TAB SAB Ect Mult Living                  Review of Systems  Constitutional: Negative for fever and chills.  Respiratory: Positive for shortness of breath. Negative for cough and wheezing.   Cardiovascular: Positive for chest pain.  Psychiatric/Behavioral: The patient is nervous/anxious.        States she feels scared, sob, and feels of doom and anxious  All other systems reviewed and are negative.    Allergies  Sulfamethoxazole  w-trimethoprim  Home Medications   Current Outpatient Rx  Name  Route  Sig  Dispense  Refill  . LEVOTHYROXINE SODIUM 50 MCG PO TABS   Oral   Take 50 mcg by mouth daily.         Marland Kitchen LISINOPRIL-HYDROCHLOROTHIAZIDE 10-12.5 MG PO TABS   Oral   Take 1 tablet by mouth daily.         . QUETIAPINE FUMARATE ER 300 MG PO TB24   Oral   Take 300 mg by mouth at bedtime.         . TRAZODONE HCL 150 MG PO TABS   Oral   Take 150 mg by mouth at bedtime.           BP 132/85  Pulse 88  Temp 97.7 F (36.5 C) (Oral)  Resp 21  SpO2 100%  Physical Exam  Nursing note and vitals reviewed. Constitutional: She is oriented to person, place, and time. She appears well-developed and well-nourished. She appears distressed.       Tearful on exam  HENT:  Head: Normocephalic and atraumatic.  Mouth/Throat: Oropharynx is clear and moist.  Eyes: Conjunctivae normal and EOM are normal. Pupils are equal, round, and reactive to light.  Neck: Normal range of motion. Neck supple.  Cardiovascular: Normal rate, regular rhythm and intact distal pulses.   No murmur heard. Pulmonary/Chest: Effort normal and breath sounds normal. No respiratory distress. She has no  wheezes. She has no rales.  Abdominal: Soft. She exhibits no distension. There is no tenderness. There is no rebound and no guarding.  Musculoskeletal: Normal range of motion. She exhibits no edema and no tenderness.  Neurological: She is alert and oriented to person, place, and time.  Skin: Skin is warm and dry. No rash noted. No erythema.  Psychiatric: Her behavior is normal. Her mood appears anxious.    ED Course  Procedures (including critical care time)  Labs Reviewed  URINALYSIS, ROUTINE W REFLEX MICROSCOPIC - Abnormal; Notable for the following:    APPearance CLOUDY (*)     Nitrite POSITIVE (*)     Leukocytes, UA LARGE (*)     All other components within normal limits  POCT I-STAT, CHEM 8 - Abnormal; Notable for the following:     Glucose, Bld 101 (*)     Calcium, Ion 1.25 (*)     All other components within normal limits  URINE MICROSCOPIC-ADD ON - Abnormal; Notable for the following:    Bacteria, UA MANY (*)     All other components within normal limits  POCT PREGNANCY, URINE  URINE CULTURE   Dg Chest 2 View  12/15/2012  *RADIOLOGY REPORT*  Clinical Data: Chest pain and short of breath  CHEST - 2 VIEW  Comparison: 09/20/2011  Findings: Heart size is normal and the vascularity is normal. Negative for heart failure.  No edema or effusion.  Mild atelectasis in the lung bases.  Negative for pneumonia or mass lesion.  IMPRESSION: Mild bibasilar atelectasis.  No acute abnormality.   Original Report Authenticated By: Janeece Riggers, M.D.      Date: 12/15/2012  Rate: 80  Rhythm: normal sinus rhythm  QRS Axis: normal  Intervals: normal  ST/T Wave abnormalities: normal  Conduction Disutrbances: none  Narrative Interpretation: unremarkable     1. Panic attack   2. UTI (lower urinary tract infection)       MDM   Patient is tearful on exam and states that she feels scared, short or breath, chest pain and anxious. Her husband is at bedside with her and states that she ran out of her medications yesterday and they could not pick them up until today. However she has not taken them today either and she states she feels like she's having a panic attack. Patient has a history of hypertension but has no coronary artery disease or other heart history. She is PERC negative. Feel most likely this is a panic attack. Chest x-ray, EKG, i-STAT pending. Patient given Ativan and will recheck.  6:48 PM  patient feeling better after Ativan labs are within normal limits except for UA that shows a significant UTI. Will treat with antibiotics      Gwyneth Sprout, MD 12/15/12 [place]  Gwyneth Sprout, MD 12/15/12 1610  Gwyneth Sprout, MD 12/15/12 1950

## 2012-12-17 LAB — URINE CULTURE: Colony Count: >=100000

## 2012-12-18 NOTE — ED Notes (Signed)
+  Urine. Patient treated with Macrobid. Sensitive to same. Per protocol MD. °

## 2012-12-27 ENCOUNTER — Ambulatory Visit: Payer: Medicare Other

## 2013-01-26 ENCOUNTER — Ambulatory Visit: Payer: Medicare Other

## 2013-02-14 ENCOUNTER — Other Ambulatory Visit (HOSPITAL_COMMUNITY)
Admission: RE | Admit: 2013-02-14 | Discharge: 2013-02-14 | Disposition: A | Discharge status: Home / Self Care | Payer: Medicare Other | Source: Ambulatory Visit | Attending: Obstetrics and Gynecology | Admitting: Obstetrics and Gynecology

## 2013-02-14 ENCOUNTER — Other Ambulatory Visit: Payer: Self-pay | Admitting: Nurse Practitioner

## 2013-02-14 DIAGNOSIS — Z124 Encounter for screening for malignant neoplasm of cervix: Secondary | ICD-10-CM | POA: Insufficient documentation

## 2013-02-14 DIAGNOSIS — Z1151 Encounter for screening for human papillomavirus (HPV): Secondary | ICD-10-CM | POA: Insufficient documentation

## 2013-02-14 DIAGNOSIS — Z113 Encounter for screening for infections with a predominantly sexual mode of transmission: Secondary | ICD-10-CM | POA: Insufficient documentation

## 2013-02-14 DIAGNOSIS — N760 Acute vaginitis: Secondary | ICD-10-CM | POA: Insufficient documentation

## 2013-03-16 ENCOUNTER — Ambulatory Visit
Admission: RE | Admit: 2013-03-16 | Discharge: 2013-03-16 | Disposition: A | Discharge status: Home / Self Care | Payer: Medicare Other | Source: Ambulatory Visit | Attending: Family Medicine | Admitting: Family Medicine

## 2013-03-16 DIAGNOSIS — Z1231 Encounter for screening mammogram for malignant neoplasm of breast: Secondary | ICD-10-CM

## 2013-04-18 ENCOUNTER — Encounter (HOSPITAL_COMMUNITY): Payer: Self-pay | Admitting: Obstetrics and Gynecology

## 2013-04-18 ENCOUNTER — Inpatient Hospital Stay (HOSPITAL_COMMUNITY)
Admission: AD | Admit: 2013-04-18 | Discharge: 2013-04-18 | Disposition: A | Discharge status: Home / Self Care | Payer: Medicare Other | Source: Ambulatory Visit | Attending: Obstetrics & Gynecology | Admitting: Obstetrics & Gynecology

## 2013-04-18 DIAGNOSIS — N83209 Unspecified ovarian cyst, unspecified side: Secondary | ICD-10-CM | POA: Insufficient documentation

## 2013-04-18 DIAGNOSIS — R102 Pelvic and perineal pain: Secondary | ICD-10-CM

## 2013-04-18 DIAGNOSIS — N949 Unspecified condition associated with female genital organs and menstrual cycle: Secondary | ICD-10-CM

## 2013-04-18 HISTORY — DX: Hypothyroidism, unspecified: E03.9

## 2013-04-18 HISTORY — DX: Anxiety disorder, unspecified: F41.9

## 2013-04-18 LAB — URINALYSIS, ROUTINE W REFLEX MICROSCOPIC
Bilirubin Urine: NEGATIVE
Glucose, UA: NEGATIVE mg/dL
Hgb urine dipstick: NEGATIVE
Ketones, ur: NEGATIVE mg/dL
Protein, ur: NEGATIVE mg/dL

## 2013-04-18 LAB — WET PREP, GENITAL
Trich, Wet Prep: NONE SEEN
Yeast Wet Prep HPF POC: NONE SEEN

## 2013-04-18 MED ORDER — IBUPROFEN 600 MG PO TABS
600.0000 mg | ORAL_TABLET | Freq: Once | ORAL | Status: AC
  Administered 2013-04-18: 600 mg via ORAL
  Filled 2013-04-18: qty 1

## 2013-04-18 MED ORDER — IBUPROFEN 600 MG PO TABS: 600.0000 mg | ORAL_TABLET | Freq: Once | ORAL | Status: DC

## 2013-04-18 NOTE — MAU Note (Signed)
Pain in vaginal area. Seen at [facility] and had [place]. Showed cyst on ovary. Inside of vagina is real hard. Told needed biopsy. Suzanne Jacobs WHNP saw pt at [facility] about 2 wks ago.

## 2013-04-18 NOTE — MAU Provider Note (Signed)
History     CSN: [id number]  Arrival date and time: 04/18/13 1627   First Provider Initiated Contact with Patient 04/18/13 1843      Chief Complaint  Patient presents with  . Vaginal Pain  . Abdominal Cramping   HPI This is a 47 y.o. female who presents with c/o pain in vaginal and pelvic areas.  Has been followed at [facility] for this. Was seen by them and told she has an ovarian cyst and a "hard place " in her vagina that needs a biopsy. Has appt to go back soon. Patient is limited in her cognition  There is a family member with her today, but she is also a poor historian  RN Note: Pain in vaginal area. Seen at [facility] and had [place]. Showed cyst on ovary. Inside of vagina is real hard. Told needed biopsy. AumaThongteum WHNP saw pt at [facility] about 2 wks ago  OB History   Grav Para Term Preterm Abortions TAB SAB Ect Mult Living   1 1        1       Past Medical History  Diagnosis Date  . Hypertension   . Hypothyroidism   . Anxiety     Past Surgical History  Procedure Laterality Date  . Hernia repair    . Tubal ligation      Family History  Problem Relation Age of Onset  . Cancer Other   . Hypertension Mother     History  Substance Use Topics  . Smoking status: Never Smoker   . Smokeless tobacco: Not on file  . Alcohol Use: No    Allergies:  Allergies  Allergen Reactions  . Peanut-Containing Drug Products Itching  . Sulfa Antibiotics Rash  . Sulfamethoxazole W-Trimethoprim Rash    Prescriptions prior to admission  Medication Sig Dispense Refill  . levothyroxine (SYNTHROID, LEVOTHROID) 50 MCG tablet Take 50 mcg by mouth daily.      Marland Kitchen lisinopril-hydrochlorothiazide (PRINZIDE,ZESTORETIC) 10-12.5 MG per tablet Take 1 tablet by mouth daily.      Marland Kitchen LORazepam (ATIVAN) 1 MG tablet Take 1 tablet (1 mg total) by mouth 3 (three) times daily as needed for anxiety.  10 tablet  0  . QUEtiapine (SEROQUEL XR) 300 MG 24 hr tablet Take 300 mg by mouth at bedtime.       . traZODone (DESYREL) 150 MG tablet Take 150 mg by mouth at bedtime.        Review of Systems  Constitutional: Negative for fever, chills and malaise/fatigue.  Gastrointestinal: Positive for abdominal pain. Negative for nausea, vomiting, diarrhea and constipation.  Genitourinary: Negative for dysuria.  Neurological: Negative for weakness and headaches.   Physical Exam   Blood pressure 105/65, temperature 97.4 F (36.3 C), resp. rate 20, height 5\' 7"  (1.702 m), weight 278 lb 3.2 oz (126.191 kg), last menstrual period 04/10/2013, SpO2 98.00%.  Physical Exam  Constitutional: She is oriented to person, place, and time. She appears well-developed and well-nourished. No distress.  HENT:  Head: Normocephalic.  Cardiovascular: Normal rate.   Respiratory: Effort normal.  GI: Soft. She exhibits no distension and no mass. There is tenderness (over suprapubic area). There is no rebound and no guarding.  Genitourinary: Vaginal discharge (milky) found.  Vagina has areas of depigmentation/whitening I don't feel any overt "hardness" or induration though hymenal ring is tight and there is some firmness to anterior rugae.    There is general tenderness to uterus and bilateral adnexae, with no cervical motion  tenderness. Exam is limited by habitus  Musculoskeletal: Normal range of motion.  Neurological: She is alert and oriented to person, place, and time.  Skin: Skin is warm and dry.  Psychiatric: She has a normal mood and affect.   Wet prep negative UA negative  MAU Course  Procedures  Assessment and Plan  A:  Pelvic pain      Known ovarian cyst      ?abnormal vaginal tissue P:  Discussed normal tests      Recommend followup with office as scheduled.      May try some ibuprofen for pain   Medication List    TAKE these medications       ibuprofen 600 MG tablet  Commonly known as:  ADVIL,MOTRIN  Take 1 tablet (600 mg total) by mouth once.     levothyroxine 50 MCG tablet  Commonly  known as:  SYNTHROID, LEVOTHROID  Take 50 mcg by mouth daily.     lisinopril-hydrochlorothiazide 10-12.5 MG per tablet  Commonly known as:  PRINZIDE,ZESTORETIC  Take 1 tablet by mouth daily.     LORazepam 1 MG tablet  Commonly known as:  ATIVAN  Take 1 tablet (1 mg total) by mouth 3 (three) times daily as needed for anxiety.     QUEtiapine 300 MG 24 hr tablet  Commonly known as:  SEROQUEL XR  Take 300 mg by mouth at bedtime.     traZODone 150 MG tablet  Commonly known as:  DESYREL  Take 150 mg by mouth at bedtime.         Wynelle Bourgeois 04/18/2013, 6:59 PM

## 2013-07-28 ENCOUNTER — Encounter (HOSPITAL_COMMUNITY): Payer: Self-pay

## 2013-08-04 ENCOUNTER — Encounter (HOSPITAL_COMMUNITY): Payer: Self-pay

## 2013-08-04 ENCOUNTER — Encounter (HOSPITAL_COMMUNITY)
Admission: RE | Admit: 2013-08-04 | Discharge: 2013-08-04 | Disposition: A | Discharge status: Home / Self Care | Payer: Medicare Other | Source: Ambulatory Visit | Attending: Obstetrics and Gynecology | Admitting: Obstetrics and Gynecology

## 2013-08-04 ENCOUNTER — Other Ambulatory Visit: Payer: Self-pay | Admitting: Obstetrics and Gynecology

## 2013-08-04 DIAGNOSIS — Z01818 Encounter for other preprocedural examination: Secondary | ICD-10-CM | POA: Insufficient documentation

## 2013-08-04 DIAGNOSIS — Z01812 Encounter for preprocedural laboratory examination: Secondary | ICD-10-CM | POA: Insufficient documentation

## 2013-08-04 HISTORY — DX: Cardiac arrhythmia, unspecified: I49.9

## 2013-08-04 LAB — CBC
MCV: 75.4 fL — ABNORMAL LOW (ref 78.0–100.0)
Platelets: 309 10*3/uL (ref 150–400)
RDW: 15.7 % — ABNORMAL HIGH (ref 11.5–15.5)
WBC: 11.1 10*3/uL — ABNORMAL HIGH (ref 4.0–10.5)

## 2013-08-04 LAB — BASIC METABOLIC PANEL
Chloride: 99 mEq/L (ref 96–112)
Creatinine, Ser: 0.98 mg/dL (ref 0.50–1.10)
GFR calc Af Amer: 79 mL/min — ABNORMAL LOW (ref >90)
Potassium: 3.9 mEq/L (ref 3.5–5.1)

## 2013-08-04 LAB — SURGICAL PCR SCREEN: MRSA, PCR: NEGATIVE

## 2013-08-04 NOTE — Patient Instructions (Addendum)
Your procedure is scheduled on:08/08/2013  Enter through the Main Entrance of [facility] at:930AM Pick up the phone at the desk and dial [phone] and inform us of your arrival.  Please call this number if you have any problems the morning of surgery: [phone]  Remember: Do not eat food after midnight:Monday 08/07/2013 Do not drink clear liquids after:700 am 08/08/2013 Take these medicines the morning of surgery with a SIP OF WATER: Ativan, Synthroid, Lisinopril  Do not wear jewelry, make-up, or FINGER nail polish No metal in your hair or on your body. Do not wear lotions, powders, perfumes. You may wear deodorant.  Please use your CHG wash as directed prior to surgery.  Do not shave anywhere for at least 12 hours prior to first CHG shower.  Do not bring valuables to the hospital. Contacts, dentures or bridgework may not be worn into surgery.  Leave suitcase in the car. After Surgery it may be brought to your room. For patients being admitted to the hospital, checkout time is 11:00am the day of discharge.  Patients discharged on the day of surgery will not be allowed to drive home.

## 2013-08-08 ENCOUNTER — Encounter (HOSPITAL_COMMUNITY)
Admission: RE | Disposition: A | Discharge status: Home / Self Care | Payer: Self-pay | Source: Ambulatory Visit | Attending: Obstetrics and Gynecology

## 2013-08-08 ENCOUNTER — Ambulatory Visit (HOSPITAL_COMMUNITY)
Admission: RE | Admit: 2013-08-08 | Discharge: 2013-08-08 | Disposition: A | Discharge status: Home / Self Care | Payer: Medicare Other | Source: Ambulatory Visit | Attending: Obstetrics and Gynecology | Admitting: Obstetrics and Gynecology

## 2013-08-08 ENCOUNTER — Encounter (HOSPITAL_COMMUNITY): Payer: Self-pay | Admitting: Anesthesiology

## 2013-08-08 ENCOUNTER — Ambulatory Visit (HOSPITAL_COMMUNITY): Payer: Medicare Other | Admitting: Anesthesiology

## 2013-08-08 DIAGNOSIS — N949 Unspecified condition associated with female genital organs and menstrual cycle: Secondary | ICD-10-CM | POA: Insufficient documentation

## 2013-08-08 DIAGNOSIS — N938 Other specified abnormal uterine and vaginal bleeding: Secondary | ICD-10-CM | POA: Insufficient documentation

## 2013-08-08 DIAGNOSIS — N939 Abnormal uterine and vaginal bleeding, unspecified: Secondary | ICD-10-CM

## 2013-08-08 HISTORY — PX: HYSTEROSCOPY WITH D & C: SHX1775

## 2013-08-08 SURGERY — DILATATION AND CURETTAGE /HYSTEROSCOPY
Anesthesia: General | Site: Vagina | Wound class: Clean Contaminated

## 2013-08-08 MED ORDER — MIDAZOLAM HCL 5 MG/5ML IJ SOLN
INTRAMUSCULAR | Status: DC | PRN
  Administered 2013-08-08: 2 mg via INTRAVENOUS

## 2013-08-08 MED ORDER — BUPIVACAINE HCL (PF) 0.25 % IJ SOLN
INTRAMUSCULAR | Status: AC
  Filled 2013-08-08: qty 30

## 2013-08-08 MED ORDER — MIDAZOLAM HCL 2 MG/2ML IJ SOLN: 0.5000 mg | Freq: Once | INTRAMUSCULAR | Status: DC | PRN

## 2013-08-08 MED ORDER — BUPIVACAINE HCL (PF) 0.25 % IJ SOLN
INTRAMUSCULAR | Status: DC | PRN
  Administered 2013-08-08: 20 mL

## 2013-08-08 MED ORDER — LIDOCAINE HCL (CARDIAC) 20 MG/ML IV SOLN
INTRAVENOUS | Status: AC
  Filled 2013-08-08: qty 5

## 2013-08-08 MED ORDER — MIDAZOLAM HCL 5 MG/5ML IJ SOLN: INTRAMUSCULAR | Status: DC | PRN

## 2013-08-08 MED ORDER — SILVER NITRATE-POT NITRATE 75-25 % EX MISC
CUTANEOUS | Status: DC | PRN
  Administered 2013-08-08: 2

## 2013-08-08 MED ORDER — IBUPROFEN 800 MG PO TABS: 800.0000 mg | ORAL_TABLET | Freq: Three times a day (TID) | ORAL | Status: DC | PRN

## 2013-08-08 MED ORDER — MIDAZOLAM HCL 2 MG/2ML IJ SOLN
INTRAMUSCULAR | Status: AC
  Filled 2013-08-08: qty 2

## 2013-08-08 MED ORDER — FENTANYL CITRATE 0.05 MG/ML IJ SOLN
INTRAMUSCULAR | Status: DC | PRN
  Administered 2013-08-08 (×4): 50 ug via INTRAVENOUS

## 2013-08-08 MED ORDER — MUPIROCIN 2 % EX OINT
TOPICAL_OINTMENT | CUTANEOUS | Status: AC
  Filled 2013-08-08: qty 22

## 2013-08-08 MED ORDER — PROMETHAZINE HCL 25 MG/ML IJ SOLN: 6.2500 mg | INTRAMUSCULAR | Status: DC | PRN

## 2013-08-08 MED ORDER — HYDROCODONE-ACETAMINOPHEN 5-325 MG PO TABS: ORAL_TABLET | ORAL | Status: DC

## 2013-08-08 MED ORDER — FENTANYL CITRATE 0.05 MG/ML IJ SOLN
INTRAMUSCULAR | Status: AC
  Filled 2013-08-08: qty 4

## 2013-08-08 MED ORDER — FENTANYL CITRATE 0.05 MG/ML IJ SOLN
INTRAMUSCULAR | Status: AC
  Administered 2013-08-08: 50 ug via INTRAVENOUS
  Filled 2013-08-08: qty 2

## 2013-08-08 MED ORDER — LACTATED RINGERS IV SOLN
INTRAVENOUS | Status: DC
  Administered 2013-08-08 (×2): via INTRAVENOUS

## 2013-08-08 MED ORDER — DOXYCYCLINE HYCLATE 50 MG PO CAPS: 100.0000 mg | ORAL_CAPSULE | Freq: Every day | ORAL | Status: DC

## 2013-08-08 MED ORDER — DEXAMETHASONE SODIUM PHOSPHATE 10 MG/ML IJ SOLN
INTRAMUSCULAR | Status: DC | PRN
  Administered 2013-08-08: 4 mg via INTRAVENOUS

## 2013-08-08 MED ORDER — ONDANSETRON HCL 4 MG/2ML IJ SOLN
INTRAMUSCULAR | Status: DC | PRN
  Administered 2013-08-08: 4 mg via INTRAVENOUS

## 2013-08-08 MED ORDER — LIDOCAINE HCL (CARDIAC) 20 MG/ML IV SOLN
INTRAVENOUS | Status: DC | PRN
  Administered 2013-08-08: 20 mg via INTRAVENOUS

## 2013-08-08 MED ORDER — FENTANYL CITRATE 0.05 MG/ML IJ SOLN: 25.0000 ug | INTRAMUSCULAR | Status: DC | PRN

## 2013-08-08 MED ORDER — MUPIROCIN 2 % EX OINT: TOPICAL_OINTMENT | Freq: Once | CUTANEOUS | Status: AC

## 2013-08-08 MED ORDER — MEPERIDINE HCL 25 MG/ML IJ SOLN: 6.2500 mg | INTRAMUSCULAR | Status: DC | PRN

## 2013-08-08 MED ORDER — KETOROLAC TROMETHAMINE 30 MG/ML IJ SOLN: 15.0000 mg | Freq: Once | INTRAMUSCULAR | Status: DC | PRN

## 2013-08-08 MED ORDER — PROPOFOL 10 MG/ML IV EMUL
INTRAVENOUS | Status: AC
  Filled 2013-08-08: qty 20

## 2013-08-08 MED ORDER — ONDANSETRON HCL 4 MG/2ML IJ SOLN
INTRAMUSCULAR | Status: AC
  Filled 2013-08-08: qty 2

## 2013-08-08 MED ORDER — DEXAMETHASONE SODIUM PHOSPHATE 10 MG/ML IJ SOLN
INTRAMUSCULAR | Status: AC
  Filled 2013-08-08: qty 1

## 2013-08-08 MED ORDER — GLYCINE 1.5 % IR SOLN
Status: DC | PRN
  Administered 2013-08-08: 3000 mL

## 2013-08-08 SURGICAL SUPPLY — 17 items
CANISTER SUCTION 2500CC (MISCELLANEOUS) ×2 IMPLANT
CATH ROBINSON RED A/P 16FR (CATHETERS) ×2 IMPLANT
CLOTH BEACON ORANGE TIMEOUT ST (SAFETY) ×2 IMPLANT
CONTAINER PREFILL 10% NBF 60ML (FORM) ×4 IMPLANT
DRESSING TELFA 8X3 (GAUZE/BANDAGES/DRESSINGS) ×2 IMPLANT
ELECT REM PT RETURN 9FT ADLT (ELECTROSURGICAL) ×2
ELECTRODE REM PT RTRN 9FT ADLT (ELECTROSURGICAL) ×1 IMPLANT
GLOVE BIOGEL M 6.5 STRL (GLOVE) ×4 IMPLANT
GLOVE BIOGEL PI IND STRL 6.5 (GLOVE) ×1 IMPLANT
GLOVE BIOGEL PI INDICATOR 6.5 (GLOVE) ×1
GOWN PREVENTION PLUS XLARGE (GOWN DISPOSABLE) ×2 IMPLANT
GOWN STRL REIN XL XLG (GOWN DISPOSABLE) ×4 IMPLANT
LOOP ANGLED CUTTING 22FR (CUTTING LOOP) IMPLANT
PACK HYSTEROSCOPY LF (CUSTOM PROCEDURE TRAY) ×2 IMPLANT
PAD OB MATERNITY 4.3X12.25 (PERSONAL CARE ITEMS) ×2 IMPLANT
TOWEL OR 17X24 6PK STRL BLUE (TOWEL DISPOSABLE) ×4 IMPLANT
WATER STERILE IRR 1000ML POUR (IV SOLUTION) ×2 IMPLANT

## 2013-08-08 NOTE — Anesthesia Preprocedure Evaluation (Signed)
Anesthesia Evaluation  Patient identified by MRN, date of birth, ID band Patient awake    Reviewed: Allergy & Precautions, H&P , Patient's Chart, lab work & pertinent test results, reviewed documented beta blocker date and time   History of Anesthesia Complications Negative for: history of anesthetic complications  Airway Mallampati: III TM Distance: >3 FB Neck ROM: full    Dental no notable dental hx.    Pulmonary neg pulmonary ROS, shortness of breath,  breath sounds clear to auscultation  Pulmonary exam normal       Cardiovascular Exercise Tolerance: Good hypertension, negative cardio ROS  + dysrhythmias Rhythm:regular Rate:Normal     Neuro/Psych PSYCHIATRIC DISORDERS Anxiety negative neurological ROS  negative psych ROS   GI/Hepatic negative GI ROS, Neg liver ROS,   Endo/Other  negative endocrine ROSHypothyroidism   Renal/GU negative Renal ROS     Musculoskeletal   Abdominal   Peds  Hematology negative hematology ROS (+) anemia ,   Anesthesia Other Findings Hypertension     Hypothyroidism        Anxiety     Dysrhythmia    Reproductive/Obstetrics negative OB ROS                           Anesthesia Physical Anesthesia Plan  ASA: III  Anesthesia Plan: General LMA   Post-op Pain Management:    Induction: Inhalational  Airway Management Planned:   Additional Equipment:   Intra-op Plan:   Post-operative Plan:   Informed Consent: I have reviewed the patients History and Physical, chart, labs and discussed the procedure including the risks, benefits and alternatives for the proposed anesthesia with the patient or authorized representative who has indicated his/her understanding and acceptance.   Dental Advisory Given  Plan Discussed with: CRNA, Surgeon and Anesthesiologist  Anesthesia Plan Comments:        induction with midazolam, fentanyl, and sevo Anesthesia Quick  Evaluation

## 2013-08-08 NOTE — Anesthesia Postprocedure Evaluation (Signed)
  Anesthesia Post Note  Patient: Suzanne Jacobs  Procedure(s) Performed: Procedure(s) (LRB): DILATATION AND CURETTAGE /HYSTEROSCOPY (N/A)  Anesthesia type: GA  Patient location: PACU  Post pain: Pain level controlled  Post assessment: Post-op Vital signs reviewed  Last Vitals:  Filed Vitals:   08/08/13 1300  BP:   Pulse: 78  Temp:   Resp: 21    Post vital signs: Reviewed  Level of consciousness: sedated  Complications: No apparent anesthesia complications

## 2013-08-08 NOTE — Transfer of Care (Signed)
Immediate Anesthesia Transfer of Care Note  Patient: Suzanne Jacobs  Procedure(s) Performed: Procedure(s): DILATATION AND CURETTAGE /HYSTEROSCOPY (N/A)  Patient Location: PACU  Anesthesia Type:General  Level of Consciousness: awake, alert , oriented and patient cooperative  Airway & Oxygen Therapy: Patient Spontanous Breathing and Patient connected to nasal cannula oxygen  Post-op Assessment: Report given to PACU RN and Post -op Vital signs reviewed and stable  Post vital signs: Reviewed and stable  Complications: No apparent anesthesia complications

## 2013-08-08 NOTE — H&P (Addendum)
Date of Initial H&P: 08/01/2013 patient had tubal ligation during cesarean section   History reviewed, patient examined, no change in status, stable for surgery.

## 2013-08-08 NOTE — Op Note (Signed)
08/08/2013  11:59 AM  PATIENT:  Suzanne Jacobs  47 y.o. female  PRE-OPERATIVE DIAGNOSIS:  ABNORMAL UTERINE BLEEDING   POST-OPERATIVE DIAGNOSIS:  ABNORMAL UTERINE BLEEDING   PROCEDURE:  Procedure(s): DILATATION AND CURETTAGE /HYSTEROSCOPY (N/A)  SURGEON:  Surgeon(s) and Role:    * Peyton Rossner J. Richardson Dopp, MD - Primary  PHYSICIAN ASSISTANT:   ASSISTANTS: none   ANESTHESIA:   general  EBL:  2 ml  Total I/O In: 1000 [I.V.:1000] Out: -   BLOOD ADMINISTERED:None  DRAINS: none   LOCAL MEDICATIONS USED:  MARCAINE     SPECIMEN:  Source of Specimen:  endometrial currettings   DISPOSITION OF SPECIMEN:  PATHOLOGY  COUNTS:  Correct   TOURNIQUET:  * No tourniquets in log *  DICTATION: .Dragon Dictation  PLAN OF CARE: Discharge to home after PACU  PATIENT DISPOSITION:  PACU - hemodynamically stable.   Delay start of Pharmacological VTE agent (>24hrs) due to surgical blood loss or risk of bleeding: not applicable  Findings: proliferative endometrium no evidence of masses or polyps.   Procedure: Patient was taken to the operating room where she was placed under general anesthesia. She was placed in the dorsal lithotomy position. She was prepped and draped in the usual sterile fashion. A speculum was placed into the vaginal vault. The anterior lip of the cervix was grasped with a single-tooth tenaculum. Quarter percent Marcaine was injected at the 4 and 8:00 positions of the cervix. The cervix was then sounded to 8 cm. The cervix was dilated to approximately 4 mm. Diagnostic hysteroscope was inserted. The findings noted above. The hysteroscope was removed.. A Sharp curet was introduced and endometrial curettings were obtained. The hysteroscope was then reinserted. There was no evidence of perforation. Hysteroscope was then removed. The single-tooth tenaculum was removed from the anterior lip of the cervix. Bleeding was noted from the tenaculum site. Silver nitrate was applied.  Excellent  hemostasis was noted. The speculum was removed from the patient's vagina. She was awakened from anesthesia taken  to the recovery room awake and in stable condition. Sponge lap and needle counts were correct x2.  Glycine deficit was 0cc.

## 2013-08-09 ENCOUNTER — Encounter (HOSPITAL_COMMUNITY): Payer: Self-pay | Admitting: Obstetrics and Gynecology

## 2014-06-19 ENCOUNTER — Other Ambulatory Visit: Payer: Self-pay | Admitting: Family Medicine

## 2014-06-19 DIAGNOSIS — Z1231 Encounter for screening mammogram for malignant neoplasm of breast: Secondary | ICD-10-CM

## 2014-07-04 ENCOUNTER — Ambulatory Visit: Payer: Medicare Other

## 2014-07-19 ENCOUNTER — Ambulatory Visit: Payer: Medicare Other

## 2014-09-04 ENCOUNTER — Ambulatory Visit: Payer: Medicare Other

## 2014-09-14 ENCOUNTER — Ambulatory Visit: Payer: Medicare Other

## 2014-09-27 ENCOUNTER — Ambulatory Visit: Payer: Medicare Other

## 2014-09-28 ENCOUNTER — Ambulatory Visit
Admission: RE | Admit: 2014-09-28 | Discharge: 2014-09-28 | Disposition: A | Discharge status: Home / Self Care | Payer: Medicare Other | Source: Ambulatory Visit | Attending: Family Medicine | Admitting: Family Medicine

## 2014-09-28 DIAGNOSIS — Z1231 Encounter for screening mammogram for malignant neoplasm of breast: Secondary | ICD-10-CM

## 2014-10-22 ENCOUNTER — Encounter (HOSPITAL_COMMUNITY): Payer: Self-pay | Admitting: Obstetrics and Gynecology

## 2014-11-24 ENCOUNTER — Emergency Department (HOSPITAL_COMMUNITY)
Admission: EM | Admit: 2014-11-24 | Discharge: 2014-11-25 | Disposition: A | Discharge status: Home / Self Care | Payer: PRIVATE HEALTH INSURANCE | Attending: Emergency Medicine | Admitting: Emergency Medicine

## 2014-11-24 ENCOUNTER — Emergency Department (HOSPITAL_COMMUNITY): Payer: PRIVATE HEALTH INSURANCE

## 2014-11-24 ENCOUNTER — Encounter (HOSPITAL_COMMUNITY): Payer: Self-pay | Admitting: Emergency Medicine

## 2014-11-24 DIAGNOSIS — R059 Cough, unspecified: Secondary | ICD-10-CM

## 2014-11-24 DIAGNOSIS — I10 Essential (primary) hypertension: Secondary | ICD-10-CM | POA: Diagnosis not present

## 2014-11-24 DIAGNOSIS — E039 Hypothyroidism, unspecified: Secondary | ICD-10-CM | POA: Diagnosis not present

## 2014-11-24 DIAGNOSIS — F419 Anxiety disorder, unspecified: Secondary | ICD-10-CM | POA: Diagnosis not present

## 2014-11-24 DIAGNOSIS — J069 Acute upper respiratory infection, unspecified: Secondary | ICD-10-CM | POA: Insufficient documentation

## 2014-11-24 DIAGNOSIS — Z79899 Other long term (current) drug therapy: Secondary | ICD-10-CM | POA: Diagnosis not present

## 2014-11-24 DIAGNOSIS — R05 Cough: Secondary | ICD-10-CM

## 2014-11-24 NOTE — ED Notes (Signed)
Patient returned from X-ray without distress noted. 

## 2014-11-24 NOTE — ED Provider Notes (Signed)
CSN: [id number]     Arrival date & time 11/24/14  2312 History   First MD Initiated Contact with Patient 11/24/14 2318     Chief Complaint  Patient presents with  . Cough     (Consider location/radiation/quality/duration/timing/severity/associated sxs/prior Treatment) HPI   Suzanne Jacobs is a 48 y.o. female with past medical history significant for hypertension complaining of dry cough onset this morning with tactile fever, pleuritic chest pain, shortness of breath and 2 episodes of posttussive emesis. Patient denies chills, myalgia, rhinorrhea, abdominal pain, cervicalgia, rash no pain medication taken prior to arrival. Note that this directly contradicts the nursing note with onset of systems being 2 weeks ago: I have directly coronary the patient she has insisted multiple times that symptoms started today.  Past Medical History  Diagnosis Date  . Hypertension   . Hypothyroidism   . Anxiety   . Dysrhythmia    Past Surgical History  Procedure Laterality Date  . Hernia repair    . Tubal ligation    . Cystectomy    . Cesarean section    . Hysteroscopy w/d&c N/A 08/08/2013    Procedure: DILATATION AND CURETTAGE /HYSTEROSCOPY;  Surgeon: Suzanne Chihuahua. Richardson Dopp, MD;  Location: [facility];  Service: Gynecology;  Laterality: N/A;   Family History  Problem Relation Age of Onset  . Cancer Other   . Hypertension Mother    History  Substance Use Topics  . Smoking status: Never Smoker   . Smokeless tobacco: Not on file  . Alcohol Use: No   OB History    Gravida Para Term Preterm AB TAB SAB Ectopic Multiple Living   1 1        1      Review of Systems  10 systems reviewed and found to be negative, except as noted in the HPI.  Allergies  Peanut-containing drug products; Sulfa antibiotics; and Sulfamethoxazole-trimethoprim  Home Medications   Prior to Admission medications   Medication Sig Start Date End Date Taking? Authorizing Provider  acetaminophen (TYLENOL) 325 MG tablet Take 650 mg  by mouth every 6 (six) hours as needed for moderate pain.   Yes Historical Provider, MD  FLUoxetine (PROZAC) 40 MG capsule Take 40 mg by mouth 2 (two) times daily.  10/25/14  Yes Historical Provider, MD  lisinopril-hydrochlorothiazide (PRINZIDE,ZESTORETIC) 10-12.5 MG per tablet Take 1 tablet by mouth daily.   Yes Historical Provider, MD  LORazepam (ATIVAN) 1 MG tablet Take 1 tablet (1 mg total) by mouth 3 (three) times daily as needed for anxiety. 12/15/12  Yes Gwyneth Sprout, MD  QUEtiapine (SEROQUEL XR) 300 MG 24 hr tablet Take 300 mg by mouth at bedtime.   Yes Historical Provider, MD  traZODone (DESYREL) 150 MG tablet Take 150 mg by mouth at bedtime.   Yes Historical Provider, MD  albuterol (PROVENTIL HFA;VENTOLIN HFA) 108 (90 BASE) MCG/ACT inhaler Inhale 2 puffs into the lungs every 4 (four) hours as needed for wheezing or shortness of breath. 11/25/14   Suzanne Piercefield, PA-C  HYDROcodone-acetaminophen (NORCO/VICODIN) 5-325 MG per tablet Take 1-2 tablets by mouth every 6 hours as needed for pain. 11/25/14   Suzanne Bram, PA-C  levothyroxine (SYNTHROID, LEVOTHROID) 50 MCG tablet Take 50 mcg by mouth daily.    Historical Provider, MD  predniSONE (DELTASONE) 50 MG tablet Take 1 tablet daily with breakfast 11/25/14   Suzanne Nop, PA-C   BP 106/62 mmHg  Pulse 95  Temp(Src) 99.2 F (37.3 C) (Oral)  Resp 18  Ht 5\' 9"  (1.753  m)  Wt 250 lb (113.399 kg)  BMI 36.90 kg/m2  SpO2 99%  LMP 06/21/2013 Physical Exam  Constitutional: She is oriented to person, place, and time. She appears well-developed and well-nourished. No distress.  HENT:  Head: Normocephalic and atraumatic.  Mouth/Throat: Oropharynx is clear and moist.  Eyes: Conjunctivae and EOM are normal. Pupils are equal, round, and reactive to light.  Neck: Normal range of motion. Neck supple.  Cardiovascular: Normal rate, regular rhythm and intact distal pulses.   Pulmonary/Chest: Effort normal and breath sounds normal. No stridor.  No respiratory distress. She has no wheezes. She has no rales. She exhibits no tenderness.  Coughing  Abdominal: Soft. Bowel sounds are normal.  Musculoskeletal: Normal range of motion. She exhibits no edema or tenderness.  Neurological: She is alert and oriented to person, place, and time.  Psychiatric: She has a normal mood and affect.  Nursing note and vitals reviewed.   ED Course  Procedures (including critical care time) Labs Review Labs Reviewed - No data to display  Imaging Review Dg Chest 2 View  11/24/2014   CLINICAL DATA:  Acute onset of cough and chest pain for 1 day. Initial encounter.  EXAM: CHEST  2 VIEW  COMPARISON:  Chest radiograph performed 12/15/2012  FINDINGS: The lungs are well-aerated and clear. There is no evidence of focal opacification, pleural effusion or pneumothorax.  The heart is borderline normal in size. No acute osseous abnormalities are seen.  IMPRESSION: No acute cardiopulmonary process seen.   Electronically Signed   By: Roanna RaiderJeffery  Chang M.D.   On: 11/24/2014 23:59     EKG Interpretation None      MDM   Final diagnoses:  Cough  URI (upper respiratory infection)    Filed Vitals:   11/24/14 2321 11/24/14 2326 11/25/14 0035 11/25/14 0054  BP:      Pulse:      Temp:   99.2 F (37.3 C)   TempSrc:   Oral   Resp:      Height:  5\' 9"  (1.753 m)    Weight:  250 lb (113.399 kg)    SpO2: 99%   99%    Medications  albuterol (PROVENTIL) (2.5 MG/3ML) 0.083% nebulizer solution 5 mg (5 mg Nebulization Given 11/25/14 0054)  HYDROcodone-acetaminophen (HYCET) 7.5-325 mg/15 ml solution 10 mL (10 mLs Oral Given 11/25/14 0031)  predniSONE (DELTASONE) tablet 60 mg (60 mg Oral Given 11/25/14 0031)    Suzanne Jacobs is a pleasant 48 y.o. female presenting with dry cough, tactile fever and 2 episodes of posttussive emesis. Lung sounds are clear to auscultation bilaterally. Chest x-ray is without infiltrate, patient is saturating well on room air, not tachycardic  or tachypneic. Will DC home with albuterol, Norco for cough and prednisone.  Evaluation does not show pathology that would require ongoing emergent intervention or inpatient treatment. Pt is hemodynamically stable and mentating appropriately. Discussed findings and plan with patient/guardian, who agrees with care plan. All questions answered. Return precautions discussed and outpatient follow up given.   New Prescriptions   ALBUTEROL (PROVENTIL HFA;VENTOLIN HFA) 108 (90 BASE) MCG/ACT INHALER    Inhale 2 puffs into the lungs every 4 (four) hours as needed for wheezing or shortness of breath.   HYDROCODONE-ACETAMINOPHEN (NORCO/VICODIN) 5-325 MG PER TABLET    Take 1-2 tablets by mouth every 6 hours as needed for pain.   PREDNISONE (DELTASONE) 50 MG TABLET    Take 1 tablet daily with breakfast  Wynetta Emeryicole Peggie Hornak, PA-C 11/25/14 0103  Hanley SeamenJohn L Molpus, MD 11/25/14 [phone]

## 2014-11-24 NOTE — ED Notes (Addendum)
Pt arrived via EMS with c/o of fever, cough, n/v and malaise. Pt reported SHOB, dry cough, and dyspnea x2 weeks. Pt reported taking Allegry pills and benadryl for cough. Pt reported cough got worse with cleaner substance. Auscultated clear breath sounds bil and throughout. Resp even and slightly labored.

## 2014-11-24 NOTE — ED Notes (Signed)
Bed: [id number] Expected date:  Expected time:  Means of arrival:  Comments: EMS 5M cough, flu like symptoms

## 2014-11-24 NOTE — ED Notes (Signed)
Awake. Verbally responsive. A/O x4. Resp even and unlabored. Continuous nonproductive cough noted. ABC's intact.

## 2014-11-24 NOTE — ED Notes (Signed)
Patient transported to X-ray 

## 2014-11-25 DIAGNOSIS — J069 Acute upper respiratory infection, unspecified: Secondary | ICD-10-CM | POA: Diagnosis not present

## 2014-11-25 MED ORDER — PREDNISONE 50 MG PO TABS: ORAL_TABLET | ORAL | Status: DC

## 2014-11-25 MED ORDER — ALBUTEROL SULFATE (2.5 MG/3ML) 0.083% IN NEBU
5.0000 mg | INHALATION_SOLUTION | Freq: Once | RESPIRATORY_TRACT | Status: AC
  Administered 2014-11-25: 5 mg via RESPIRATORY_TRACT
  Filled 2014-11-25: qty 6

## 2014-11-25 MED ORDER — PREDNISONE 20 MG PO TABS
60.0000 mg | ORAL_TABLET | Freq: Once | ORAL | Status: AC
  Administered 2014-11-25: 60 mg via ORAL
  Filled 2014-11-25: qty 3

## 2014-11-25 MED ORDER — ALBUTEROL SULFATE HFA 108 (90 BASE) MCG/ACT IN AERS: 2.0000 | INHALATION_SPRAY | RESPIRATORY_TRACT | Status: DC | PRN

## 2014-11-25 MED ORDER — HYDROCODONE-ACETAMINOPHEN 5-325 MG PO TABS: ORAL_TABLET | ORAL | Status: DC

## 2014-11-25 MED ORDER — HYDROCODONE-ACETAMINOPHEN 7.5-325 MG/15ML PO SOLN
10.0000 mL | Freq: Once | ORAL | Status: AC
  Administered 2014-11-25: 10 mL via ORAL
  Filled 2014-11-25: qty 15

## 2014-11-25 NOTE — ED Notes (Signed)
Awake. Verbally responsive. A/O x4. Resp even and unlabored. No audible adventitious breath sounds noted. ABC's intact.  

## 2014-11-25 NOTE — ED Notes (Signed)
Awake. Verbally responsive. A/O x4. Resp even and unlabored. Continues to have occ dry cough noted. ABC's intact. NAD noted.

## 2014-11-25 NOTE — Discharge Instructions (Signed)
For fever and pain control, please take Ibuprofen (also known as Motrin or Advil) 400mg  (this is normally 2 over the counter pills) every 6 hours. Take with food to minimize stomach irritation.  Take vicodin for breakthrough pain, do not drink alcohol, drive, care for children or do other critical tasks while taking vicodin.  Push fluids: take small frequent sips of water or Gatorade, do not drink any soda, juice or caffeinated beverages.    Slowly resume solid diet as desired. Avoid food that are spicy, contain dairy and/or have high fat content.  Please follow with your primary care doctor in the next 2 days for a check-up. They must obtain records for further management.   Do not hesitate to return to the Emergency Department for any new, worsening or concerning symptoms.   Upper Respiratory Infection, Adult An upper respiratory infection (URI) is also sometimes known as the common cold. The upper respiratory tract includes the nose, sinuses, throat, trachea, and bronchi. Bronchi are the airways leading to the lungs. Most people improve within 1 week, but symptoms can last up to 2 weeks. A residual cough may last even longer.  CAUSES Many different viruses can infect the tissues lining the upper respiratory tract. The tissues become irritated and inflamed and often become very moist. Mucus production is also common. A cold is contagious. You can easily spread the virus to others by oral contact. This includes kissing, sharing a glass, coughing, or sneezing. Touching your mouth or nose and then touching a surface, which is then touched by another person, can also spread the virus. SYMPTOMS  Symptoms typically develop 1 to 3 days after you come in contact with a cold virus. Symptoms vary from person to person. They may include:  Runny nose.  Sneezing.  Nasal congestion.  Sinus irritation.  Sore throat.  Loss of voice (laryngitis).  Cough.  Fatigue.  Muscle aches.  Loss of  appetite.  Headache.  Low-grade fever. DIAGNOSIS  You might diagnose your own cold based on familiar symptoms, since most people get a cold 2 to 3 times a year. Your caregiver can confirm this based on your exam. Most importantly, your caregiver can check that your symptoms are not due to another disease such as strep throat, sinusitis, pneumonia, asthma, or epiglottitis. Blood tests, throat tests, and X-rays are not necessary to diagnose a common cold, but they may sometimes be helpful in excluding other more serious diseases. Your caregiver will decide if any further tests are required. RISKS AND COMPLICATIONS  You may be at risk for a more severe case of the common cold if you smoke cigarettes, have chronic heart disease (such as heart failure) or lung disease (such as asthma), or if you have a weakened immune system. The very young and very old are also at risk for more serious infections. Bacterial sinusitis, middle ear infections, and bacterial pneumonia can complicate the common cold. The common cold can worsen asthma and chronic obstructive pulmonary disease (COPD). Sometimes, these complications can require emergency medical care and may be life-threatening. PREVENTION  The best way to protect against getting a cold is to practice good hygiene. Avoid oral or hand contact with people with cold symptoms. Wash your hands often if contact occurs. There is no clear evidence that vitamin C, vitamin E, echinacea, or exercise reduces the chance of developing a cold. However, it is always recommended to get plenty of rest and practice good nutrition. TREATMENT  Treatment is directed at relieving symptoms. There  is no cure. Antibiotics are not effective, because the infection is caused by a virus, not by bacteria. Treatment may include:  Increased fluid intake. Sports drinks offer valuable electrolytes, sugars, and fluids.  Breathing heated mist or steam (vaporizer or shower).  Eating chicken soup  or other clear broths, and maintaining good nutrition.  Getting plenty of rest.  Using gargles or lozenges for comfort.  Controlling fevers with ibuprofen or acetaminophen as directed by your caregiver.  Increasing usage of your inhaler if you have asthma. Zinc gel and zinc lozenges, taken in the first 24 hours of the common cold, can shorten the duration and lessen the severity of symptoms. Pain medicines may help with fever, muscle aches, and throat pain. A variety of non-prescription medicines are available to treat congestion and runny nose. Your caregiver can make recommendations and may suggest nasal or lung inhalers for other symptoms.  HOME CARE INSTRUCTIONS   Only take over-the-counter or prescription medicines for pain, discomfort, or fever as directed by your caregiver.  Use a warm mist humidifier or inhale steam from a shower to increase air moisture. This may keep secretions moist and make it easier to breathe.  Drink enough water and fluids to keep your urine clear or pale yellow.  Rest as needed.  Return to work when your temperature has returned to normal or as your caregiver advises. You may need to stay home longer to avoid infecting others. You can also use a face mask and careful hand washing to prevent spread of the virus. SEEK MEDICAL CARE IF:   After the first few days, you feel you are getting worse rather than better.  You need your caregiver's advice about medicines to control symptoms.  You develop chills, worsening shortness of breath, or brown or red sputum. These may be signs of pneumonia.  You develop yellow or brown nasal discharge or pain in the face, especially when you bend forward. These may be signs of sinusitis.  You develop a fever, swollen neck glands, pain with swallowing, or white areas in the back of your throat. These may be signs of strep throat. SEEK IMMEDIATE MEDICAL CARE IF:   You have a fever.  You develop severe or persistent  headache, ear pain, sinus pain, or chest pain.  You develop wheezing, a prolonged cough, cough up blood, or have a change in your usual mucus (if you have chronic lung disease).  You develop sore muscles or a stiff neck. Document Released: 06/02/2001 Document Revised: 02/29/2012 Document Reviewed: 03/14/2014 [facility] Patient Information 2015 [place], [place]. This information is not intended to replace advice given to you by your health care provider. Make sure you discuss any questions you have with your health care provider.

## 2015-09-30 ENCOUNTER — Other Ambulatory Visit (HOSPITAL_COMMUNITY)
Admission: RE | Admit: 2015-09-30 | Discharge: 2015-09-30 | Disposition: A | Discharge status: Home / Self Care | Payer: Medicare Other | Source: Ambulatory Visit | Attending: Family Medicine | Admitting: Family Medicine

## 2015-09-30 DIAGNOSIS — Z124 Encounter for screening for malignant neoplasm of cervix: Secondary | ICD-10-CM | POA: Diagnosis present

## 2015-11-17 ENCOUNTER — Emergency Department (HOSPITAL_COMMUNITY)
Admission: EM | Admit: 2015-11-17 | Discharge: 2015-11-17 | Disposition: A | Discharge status: Home / Self Care | Payer: Medicare Other | Attending: Emergency Medicine | Admitting: Emergency Medicine

## 2015-11-17 ENCOUNTER — Encounter (HOSPITAL_COMMUNITY): Payer: Self-pay | Admitting: Emergency Medicine

## 2015-11-17 DIAGNOSIS — Z7952 Long term (current) use of systemic steroids: Secondary | ICD-10-CM | POA: Insufficient documentation

## 2015-11-17 DIAGNOSIS — I10 Essential (primary) hypertension: Secondary | ICD-10-CM | POA: Insufficient documentation

## 2015-11-17 DIAGNOSIS — Z79899 Other long term (current) drug therapy: Secondary | ICD-10-CM | POA: Diagnosis not present

## 2015-11-17 DIAGNOSIS — Z760 Encounter for issue of repeat prescription: Secondary | ICD-10-CM | POA: Diagnosis not present

## 2015-11-17 DIAGNOSIS — F419 Anxiety disorder, unspecified: Secondary | ICD-10-CM | POA: Diagnosis not present

## 2015-11-17 DIAGNOSIS — E039 Hypothyroidism, unspecified: Secondary | ICD-10-CM | POA: Insufficient documentation

## 2015-11-17 MED ORDER — ALBUTEROL SULFATE HFA 108 (90 BASE) MCG/ACT IN AERS
2.0000 | INHALATION_SPRAY | Freq: Once | RESPIRATORY_TRACT | Status: AC
  Administered 2015-11-17: 2 via RESPIRATORY_TRACT
  Filled 2015-11-17: qty 6.7

## 2015-11-17 MED ORDER — ALBUTEROL SULFATE HFA 108 (90 BASE) MCG/ACT IN AERS: 2.0000 | INHALATION_SPRAY | RESPIRATORY_TRACT | Status: DC | PRN

## 2015-11-17 MED ORDER — AEROCHAMBER PLUS FLO-VU MEDIUM MISC
1.0000 | Freq: Once | Status: AC
  Administered 2015-11-17: 1
  Filled 2015-11-17: qty 1

## 2015-11-17 NOTE — ED Provider Notes (Signed)
CSN: [id number]     Arrival date & time 11/17/15  1207 History  By signing my name below, I, Suzanne Jacobs, attest that this documentation has been prepared under the direction and in the presence of [employer], PA-C. Electronically Signed: Elon Jacobs ED Scribe. 11/17/2015. 12:47 PM.    Chief Complaint  Patient presents with  . Medication Refill   The history is provided by the patient. No language interpreter was used.   HPI Comments: Suzanne Jacobs is a 49 y.o. female who presents to the Emergency Department complaining of requesting a refill of her albuterol inhaler which she ran out of last night.  She has not used her albuterol inhaler today.  She denies SOB, difficulty breathing.     PCP: Dr. Renato Gails Past Medical History  Diagnosis Date  . Hypertension   . Hypothyroidism   . Anxiety   . Dysrhythmia    Past Surgical History  Procedure Laterality Date  . Hernia repair    . Tubal ligation    . Cystectomy    . Cesarean section    . Hysteroscopy w/d&c N/A 08/08/2013    Procedure: DILATATION AND CURETTAGE /HYSTEROSCOPY;  Surgeon: Dorien Chihuahua. Richardson Dopp, MD;  Location: [facility];  Service: Gynecology;  Laterality: N/A;   Family History  Problem Relation Age of Onset  . Cancer Other   . Hypertension Mother    Social History  Substance Use Topics  . Smoking status: Never Smoker   . Smokeless tobacco: None  . Alcohol Use: No   OB History    Gravida Para Term Preterm AB TAB SAB Ectopic Multiple Living   Review of Systems A complete 10 system review of systems was obtained and all systems are negative except as noted in the HPI and PMH.   Allergies  Peanut-containing drug products; Sulfa antibiotics; and Sulfamethoxazole-trimethoprim  Home Medications   Prior to Admission medications   Medication Sig Start Date End Date Taking? Authorizing Provider  acetaminophen (TYLENOL) 325 MG tablet Take 650 mg by mouth every 6 (six) hours as needed for moderate pain.     Historical Provider, MD  albuterol (PROVENTIL HFA;VENTOLIN HFA) 108 (90 BASE) MCG/ACT inhaler Inhale 2 puffs into the lungs every 2 (two) hours as needed for wheezing or shortness of breath (cough). 11/17/15   Joycie Peek, PA-C  FLUoxetine (PROZAC) 40 MG capsule Take 40 mg by mouth 2 (two) times daily.  10/25/14   Historical Provider, MD  HYDROcodone-acetaminophen (NORCO/VICODIN) 5-325 MG per tablet Take 1-2 tablets by mouth every 6 hours as needed for pain. 11/25/14   Nicole Pisciotta, PA-C  levothyroxine (SYNTHROID, LEVOTHROID) 50 MCG tablet Take 50 mcg by mouth daily.    Historical Provider, MD  lisinopril-hydrochlorothiazide (PRINZIDE,ZESTORETIC) 10-12.5 MG per tablet Take 1 tablet by mouth daily.    Historical Provider, MD  LORazepam (ATIVAN) 1 MG tablet Take 1 tablet (1 mg total) by mouth 3 (three) times daily as needed for anxiety. 12/15/12   Gwyneth Sprout, MD  predniSONE (DELTASONE) 50 MG tablet Take 1 tablet daily with breakfast 11/25/14   Joni Reining Pisciotta, PA-C  QUEtiapine (SEROQUEL XR) 300 MG 24 hr tablet Take 300 mg by mouth at bedtime.    Historical Provider, MD  traZODone (DESYREL) 150 MG tablet Take 150 mg by mouth at bedtime.    Historical Provider, MD   BP 148/76 mmHg  Pulse 103  Temp(Src) 98.3 F (36.8 C) (Oral)  Resp 18  Wt 130.455 kg  SpO2 96%  LMP 06/21/2013 Physical Exam  Constitutional: She is oriented to person, place, and time. She appears well-developed and well-nourished. No distress.  HENT:  Head: Normocephalic and atraumatic.  Eyes: Conjunctivae and EOM are normal.  Neck: Neck supple. No tracheal deviation present.  Cardiovascular: Normal rate and normal heart sounds.   Pulmonary/Chest: Effort normal. No respiratory distress.  No peripheral edema.   Musculoskeletal: Normal range of motion.  Neurological: She is alert and oriented to person, place, and time.  Skin: Skin is warm and dry.  Psychiatric: She has a normal mood and affect. Her behavior is  normal.  Nursing note and vitals reviewed.   ED Course  Procedures (including critical care time)  DIAGNOSTIC STUDIES: Oxygen Saturation is 96% on RA, normal by my interpretation.    COORDINATION OF CARE:  12:39 PM Will order/prescribe albuterol inhaler.  Advised patient to f/u with PCP in 1 week if needed.  Patient advised of return precautions.  Patient acknowledges and agrees with plan.    Labs Review Labs Reviewed - No data to display  Imaging Review No results found. I have personally reviewed and evaluated these images and lab results as part of my medical decision-making.   EKG Interpretation None     Meds given in ED:  Medications  AEROCHAMBER PLUS FLO-VU MEDIUM MISC 1 each (not administered)  albuterol (PROVENTIL HFA;VENTOLIN HFA) 108 (90 BASE) MCG/ACT inhaler 2 puff (2 puffs Inhalation Given 11/17/15 1250)    New Prescriptions   ALBUTEROL (PROVENTIL HFA;VENTOLIN HFA) 108 (90 BASE) MCG/ACT INHALER    Inhale 2 puffs into the lungs every 2 (two) hours as needed for wheezing or shortness of breath (cough).    MDM  Suzanne Jacobs is a 49 y.o. female presents today for refill of inhaler. Patient reports she was seen last night  at an outpatient facility and discharged with steroid medication as well as an inhaler. She reports losing the inhaler and needs a refill.  Denies any other cardiopulmonary complaints. Patient given an inhaler in the ED and encouraged to continue taking briefly prescribed medications. Follow up with PCP as needed. Return to ED for any new or worsening symptoms. Final diagnoses:  Medication refill    I personally performed the services described in this documentation, which was scribed in my presence. The recorded information has been reviewed and is accurate.   Joycie PeekBenjamin Mclain Freer, PA-C 11/17/15 1301  Gerhard Munchobert Lockwood, MD 11/17/15 [phone]

## 2015-11-17 NOTE — Discharge Instructions (Signed)
Please follow-up with your doctor next week for reevaluation of your symptoms. Usually inhaler as directed to help with any chest tightness or wheezing.  Medicine Refill at the Emergency Department We have refilled your medicine today, but it is best for you to get refills through your primary health care provider's office. In the future, please plan ahead so you do not need to get refills from the emergency department. If the medicine we refilled was a maintenance medicine, you may have received only enough to get you by until you are able to see your regular health care provider.   This information is not intended to replace advice given to you by your health care provider. Make sure you discuss any questions you have with your health care provider.   Document Released: 03/25/2004 Document Revised: 12/28/2014 Document Reviewed: 03/16/2014 Elsevier Interactive Patient Education [employer]2016 Elsevier Inc.

## 2015-11-17 NOTE — ED Notes (Signed)
Out of inhaler.

## 2016-07-15 ENCOUNTER — Other Ambulatory Visit: Payer: Self-pay | Admitting: Family Medicine

## 2016-07-15 DIAGNOSIS — R350 Frequency of micturition: Secondary | ICD-10-CM

## 2016-07-15 DIAGNOSIS — R1030 Lower abdominal pain, unspecified: Secondary | ICD-10-CM

## 2016-08-10 ENCOUNTER — Other Ambulatory Visit: Payer: Medicare Other

## 2016-08-14 ENCOUNTER — Ambulatory Visit
Admission: RE | Admit: 2016-08-14 | Discharge: 2016-08-14 | Disposition: A | Discharge status: Home / Self Care | Payer: Medicare Other | Source: Ambulatory Visit | Attending: Family Medicine | Admitting: Family Medicine

## 2016-08-14 DIAGNOSIS — R1030 Lower abdominal pain, unspecified: Secondary | ICD-10-CM

## 2016-08-14 DIAGNOSIS — R350 Frequency of micturition: Secondary | ICD-10-CM

## 2016-08-17 ENCOUNTER — Other Ambulatory Visit: Payer: Self-pay | Admitting: Family Medicine

## 2016-08-17 DIAGNOSIS — N83202 Unspecified ovarian cyst, left side: Secondary | ICD-10-CM

## 2016-08-30 ENCOUNTER — Other Ambulatory Visit: Payer: Medicare Other

## 2016-09-09 ENCOUNTER — Ambulatory Visit
Admission: RE | Admit: 2016-09-09 | Discharge: 2016-09-09 | Disposition: A | Discharge status: Home / Self Care | Payer: Medicare Other | Source: Ambulatory Visit | Attending: Family Medicine | Admitting: Family Medicine

## 2016-09-09 DIAGNOSIS — N83202 Unspecified ovarian cyst, left side: Secondary | ICD-10-CM

## 2016-09-09 MED ORDER — GADOBENATE DIMEGLUMINE 529 MG/ML IV SOLN
20.0000 mL | Freq: Once | INTRAVENOUS | Status: AC | PRN
  Administered 2016-09-09: 20 mL via INTRAVENOUS

## 2016-09-30 ENCOUNTER — Other Ambulatory Visit: Payer: Self-pay | Admitting: Obstetrics and Gynecology

## 2016-09-30 ENCOUNTER — Other Ambulatory Visit (HOSPITAL_COMMUNITY)
Admission: RE | Admit: 2016-09-30 | Discharge: 2016-09-30 | Disposition: A | Discharge status: Home / Self Care | Payer: Medicare Other | Source: Ambulatory Visit | Attending: Obstetrics and Gynecology | Admitting: Obstetrics and Gynecology

## 2016-09-30 DIAGNOSIS — Z1151 Encounter for screening for human papillomavirus (HPV): Secondary | ICD-10-CM | POA: Insufficient documentation

## 2016-10-01 LAB — CERVICOVAGINAL ANCILLARY ONLY: HPV: NOT DETECTED

## 2016-10-07 ENCOUNTER — Other Ambulatory Visit: Payer: Self-pay | Admitting: Obstetrics and Gynecology

## 2016-10-07 DIAGNOSIS — N838 Other noninflammatory disorders of ovary, fallopian tube and broad ligament: Secondary | ICD-10-CM

## 2016-10-13 ENCOUNTER — Ambulatory Visit
Admission: RE | Admit: 2016-10-13 | Discharge: 2016-10-13 | Disposition: A | Discharge status: Home / Self Care | Payer: Medicare Other | Source: Ambulatory Visit | Attending: Obstetrics and Gynecology | Admitting: Obstetrics and Gynecology

## 2016-10-13 DIAGNOSIS — N838 Other noninflammatory disorders of ovary, fallopian tube and broad ligament: Secondary | ICD-10-CM

## 2016-10-13 MED ORDER — IOPAMIDOL (ISOVUE-300) INJECTION 61%
125.0000 mL | Freq: Once | INTRAVENOUS | Status: AC | PRN
  Administered 2016-10-13: 125 mL via INTRAVENOUS

## 2016-11-03 ENCOUNTER — Other Ambulatory Visit: Payer: Self-pay | Admitting: Obstetrics and Gynecology

## 2016-11-05 ENCOUNTER — Encounter (HOSPITAL_COMMUNITY): Payer: Self-pay | Admitting: *Deleted

## 2016-11-10 NOTE — Patient Instructions (Addendum)
Your procedure is scheduled on:  Monday, Nov. 27, 2017  [facility] at:  12:00 Noon  Pick up the phone at the desk and dial [phone].  Call this number if you have problems the morning of surgery: [phone].  Remember: Do NOT eat food:  After Midnight Sunday  Do NOT drink clear liquids after:  9:00 AM day of surgery  Take these medicines the morning of surgery with a SIP OF WATER:  Lamotrigine, Levothyroxine, Lisinopril  Bring Asthma Inhaler day of surgery  Stop ALL herbal medications at this time   Do NOT wear jewelry (body piercing), metal hair clips/bobby pins, make-up, or nail polish. Do NOT wear lotions, powders, or perfumes.  You may wear deodorant. Do NOT shave for 48 hours prior to surgery. Do NOT bring valuables to the hospital. Contacts, dentures, or bridgework may not be worn into surgery.  Have a responsible adult drive you home and stay with you for 24 hours after your procedure

## 2016-11-11 ENCOUNTER — Encounter (HOSPITAL_COMMUNITY)
Admission: RE | Admit: 2016-11-11 | Discharge: 2016-11-11 | Disposition: A | Discharge status: Home / Self Care | Payer: Medicare Other | Source: Ambulatory Visit | Attending: Obstetrics and Gynecology | Admitting: Obstetrics and Gynecology

## 2016-11-11 ENCOUNTER — Encounter (HOSPITAL_COMMUNITY): Payer: Self-pay

## 2016-11-11 DIAGNOSIS — Z01810 Encounter for preprocedural cardiovascular examination: Secondary | ICD-10-CM | POA: Insufficient documentation

## 2016-11-11 DIAGNOSIS — Z808 Family history of malignant neoplasm of other organs or systems: Secondary | ICD-10-CM | POA: Insufficient documentation

## 2016-11-11 DIAGNOSIS — F329 Major depressive disorder, single episode, unspecified: Secondary | ICD-10-CM | POA: Diagnosis not present

## 2016-11-11 DIAGNOSIS — Z803 Family history of malignant neoplasm of breast: Secondary | ICD-10-CM | POA: Insufficient documentation

## 2016-11-11 DIAGNOSIS — Z9889 Other specified postprocedural states: Secondary | ICD-10-CM | POA: Insufficient documentation

## 2016-11-11 DIAGNOSIS — Z823 Family history of stroke: Secondary | ICD-10-CM | POA: Insufficient documentation

## 2016-11-11 DIAGNOSIS — K219 Gastro-esophageal reflux disease without esophagitis: Secondary | ICD-10-CM | POA: Insufficient documentation

## 2016-11-11 DIAGNOSIS — Z79899 Other long term (current) drug therapy: Secondary | ICD-10-CM | POA: Diagnosis not present

## 2016-11-11 DIAGNOSIS — N83299 Other ovarian cyst, unspecified side: Secondary | ICD-10-CM | POA: Diagnosis not present

## 2016-11-11 DIAGNOSIS — I10 Essential (primary) hypertension: Secondary | ICD-10-CM | POA: Diagnosis not present

## 2016-11-11 DIAGNOSIS — E039 Hypothyroidism, unspecified: Secondary | ICD-10-CM | POA: Diagnosis not present

## 2016-11-11 DIAGNOSIS — Z01812 Encounter for preprocedural laboratory examination: Secondary | ICD-10-CM | POA: Insufficient documentation

## 2016-11-11 HISTORY — DX: Personal history of Methicillin resistant Staphylococcus aureus infection: Z86.14

## 2016-11-11 HISTORY — DX: Other specified postprocedural states: Z98.890

## 2016-11-11 HISTORY — DX: Nausea with vomiting, unspecified: R11.2

## 2016-11-11 HISTORY — DX: Unspecified asthma, uncomplicated: J45.909

## 2016-11-11 HISTORY — DX: Dyspnea, unspecified: R06.00

## 2016-11-11 LAB — BASIC METABOLIC PANEL
ANION GAP: 7 (ref 5–15)
BUN: 12 mg/dL (ref 6–20)
CHLORIDE: 106 mmol/L (ref 101–111)
CO2: 22 mmol/L (ref 22–32)
Calcium: 9.4 mg/dL (ref 8.9–10.3)
Creatinine, Ser: 0.87 mg/dL (ref 0.44–1.00)
GFR calc non Af Amer: >60 mL/min (ref >60)
Glucose, Bld: 128 mg/dL — ABNORMAL HIGH (ref 65–99)
POTASSIUM: 3.7 mmol/L (ref 3.5–5.1)
SODIUM: 135 mmol/L (ref 135–145)

## 2016-11-11 LAB — CBC
HEMATOCRIT: 29.7 % — AB (ref 36.0–46.0)
HEMOGLOBIN: 10 g/dL — AB (ref 12.0–15.0)
MCH: 25.4 pg — ABNORMAL LOW (ref 26.0–34.0)
MCHC: 33.7 g/dL (ref 30.0–36.0)
MCV: 75.4 fL — ABNORMAL LOW (ref 78.0–100.0)
Platelets: 302 10*3/uL (ref 150–400)
RBC: 3.94 MIL/uL (ref 3.87–5.11)
RDW: 16.4 % — ABNORMAL HIGH (ref 11.5–15.5)
WBC: 9 10*3/uL (ref 4.0–10.5)

## 2016-11-11 LAB — TYPE AND SCREEN
ABO/RH(D): A POS
ANTIBODY SCREEN: NEGATIVE

## 2016-11-12 LAB — ABO/RH: ABO/RH(D): A POS

## 2016-11-15 NOTE — H&P (Signed)
History of Present Illness  General:  Pt presents for preop for removal of pelvic mass, benign ovarian cyst suspected. Pt was originally referred for large ovarian mass seen on ultrasound 7.2 x 4.4 x 4.3 cm. Septated bilobed. Complex. On MRI, 7.4 x 5.5x 2.7, mass occupies mid to upper pelvis. Caudal end not visualized. No ascites or lymphadenopathy. CT showed a 6.2 cm cyst without septations. Appearance favors a benign lesion. CA 125 was normal.  Pt orginally had pain at the time the pelvic mass was diagnosed. No pain currently. Last pain 1 month ago. no n/v, fever chills. Not sexually active. No menses since September. Denies vaginal discharge.   Current Medications  Taking   Levothyroxine Sodium 50 MCG Tablet 1 tablet by mouth every morning   Trazodone HCl 150 MG Tablet 1 tablet by mouth at bedtime for sleep - Dr. Mila HomerSena   Seroquel(QUEtiapine Fumarate) 300 MG Tablet 1 tablet by mouth every evening   Lorazepam 1 MG Tablet 1 tablet as needed Orally Twice a day   Prozac(FLUoxetine HCl) 40 MG Capsule 1 capsule by mouth every morning   Zyrtec Allergy(Cetirizine HCl) 10 MG Tablet 1 tablet Orally Once a day   ProAir HFA(Albuterol Sulfate HFA) 108 (90 Base) MCG/ACT Aerosol Solution 2 puffs as needed Inhalation every 4 hrs   Ibuprofen 600 MG Tablet 1 tablet Orally every 6 hrs x 24 hours prior to menses then q 6 hours prn pain   Omeprazole 20 MG Capsule Delayed Release 1 capsule Orally twice a day   Lisinopril 20 MG Tablet 1 tablet by mouth every morning   Not-Taking/PRN   Flonase(Fluticasone Propionate) 50 MCG/ACT Suspension 2 sprays each nostril Nasally at bedtime   Medication List reviewed and reconciled with the patient    Past Medical History  Hypothyroidism.   Allergic Rhinitis.   Insomnia.   GERD.   Depression, followed by psychiatry.   Hypertension. .   Keloid.           Surgical History  c-section x 1   hysteroscopy D&C 08/08/13   Family History  Father: deceased,  cancer, liver  Mother: alive, hyperlipidemia, diagnosed with HTN, CVA in 4586  Paternal Grand Father: deceased  Paternal Grand Mother: deceased, diagnosed with Breast Ca  Maternal Grand Father: deceased, diagnosed with CVA in 6060  Maternal Grand Mother: deceased, diagnosed with Breast Ca   Social History  General:  Tobacco use  cigarettes: Never smoked Tobacco history last updated 11/09/2016 EXPOSURE TO PASSIVE SMOKE: never.  Alcohol: never.  Caffeine: yes, very little.  Recreational drug use: never.  Exercise: NONE.  Marital Status: Single.  Children: 1 girl.  Religion: Christian.  Seat belt use: yes.  Engaged to be married.   Gyn History  Sexual activity not currently sexually active.  Periods : irregular.  LMP 08/2016, unsure of dates.  Denies H/O Birth control.  Last pap smear date 09/30/15, negative.  Denies H/O Abnormal pap smear.  Denies H/O STD.    OB History  Number of pregnancies 1.  Pregnancy # 1 live birth, C-section delivery.    Allergies  Sulfa drugs (for allergy): Hives   Hospitalization/Major Diagnostic Procedure  childbirth x 1   keloid removal x2 2013   Review of Systems  Denies fever/chills, chest pain, SOB, headaches, numbness/tingling. No h/o complication with anesthesia, bleeding disorders or blood clots.   Vital Signs  Wt 290, Wt change -1.8 lb, Ht 67, BMI 45.42, Pulse sitting 100, BP sitting 113/73.   Physical Examination  GENERAL:  Patient appears alert and oriented.  General Appearance: well-appearing, well-developed, no acute distress.  Speech: clear.  LUNGS:  Auscultation: no wheezing/rhonchi/rales. CTA bilaterally.  HEART:  Heart sounds: normal. RRR. no murmur.  ABDOMEN:  General: soft nontender, nondistended, no masses.  FEMALE GENITOURINARY:  Pelvic Not examined.  EXTREMITIES:  General: No edema or calf tenderness.     Assessments   1. Pre-operative clearance - Z01.818 (Primary)   2. Ovarian cyst, complex - N83.299,  Suspected. ORCA score, tumor markers and imaging suggestive of benign neoplasm.   Treatment  1. Pre-operative clearance  Notes: Proceed with resection of mass. Likely LSO. Pt cautioned that if cancerous, second surgery may be necessary to remove lymph nodes. R/B/A reviewed with pt and her sister.    Follow Up  2 Weeks post op

## 2016-11-16 ENCOUNTER — Ambulatory Visit (HOSPITAL_COMMUNITY): Payer: Medicare Other | Admitting: Anesthesiology

## 2016-11-16 ENCOUNTER — Ambulatory Visit (HOSPITAL_COMMUNITY)
Admission: RE | Admit: 2016-11-16 | Discharge: 2016-11-16 | Disposition: A | Discharge status: Home / Self Care | Payer: Medicare Other | Source: Ambulatory Visit | Attending: Obstetrics and Gynecology | Admitting: Obstetrics and Gynecology

## 2016-11-16 ENCOUNTER — Encounter (HOSPITAL_COMMUNITY)
Admission: RE | Disposition: A | Discharge status: Home / Self Care | Payer: Self-pay | Source: Ambulatory Visit | Attending: Obstetrics and Gynecology

## 2016-11-16 ENCOUNTER — Encounter (HOSPITAL_COMMUNITY): Payer: Self-pay | Admitting: *Deleted

## 2016-11-16 DIAGNOSIS — J45909 Unspecified asthma, uncomplicated: Secondary | ICD-10-CM | POA: Diagnosis not present

## 2016-11-16 DIAGNOSIS — K660 Peritoneal adhesions (postprocedural) (postinfection): Secondary | ICD-10-CM | POA: Insufficient documentation

## 2016-11-16 DIAGNOSIS — N838 Other noninflammatory disorders of ovary, fallopian tube and broad ligament: Secondary | ICD-10-CM | POA: Insufficient documentation

## 2016-11-16 DIAGNOSIS — F419 Anxiety disorder, unspecified: Secondary | ICD-10-CM | POA: Diagnosis not present

## 2016-11-16 DIAGNOSIS — I10 Essential (primary) hypertension: Secondary | ICD-10-CM | POA: Diagnosis not present

## 2016-11-16 DIAGNOSIS — K219 Gastro-esophageal reflux disease without esophagitis: Secondary | ICD-10-CM | POA: Insufficient documentation

## 2016-11-16 DIAGNOSIS — Z6841 Body Mass Index (BMI) 40.0 and over, adult: Secondary | ICD-10-CM | POA: Diagnosis not present

## 2016-11-16 DIAGNOSIS — Z882 Allergy status to sulfonamides status: Secondary | ICD-10-CM | POA: Diagnosis not present

## 2016-11-16 DIAGNOSIS — G4700 Insomnia, unspecified: Secondary | ICD-10-CM | POA: Insufficient documentation

## 2016-11-16 DIAGNOSIS — N83202 Unspecified ovarian cyst, left side: Secondary | ICD-10-CM | POA: Diagnosis present

## 2016-11-16 DIAGNOSIS — D649 Anemia, unspecified: Secondary | ICD-10-CM | POA: Insufficient documentation

## 2016-11-16 DIAGNOSIS — F319 Bipolar disorder, unspecified: Secondary | ICD-10-CM | POA: Diagnosis not present

## 2016-11-16 DIAGNOSIS — Z91010 Allergy to peanuts: Secondary | ICD-10-CM | POA: Diagnosis not present

## 2016-11-16 DIAGNOSIS — E039 Hypothyroidism, unspecified: Secondary | ICD-10-CM | POA: Diagnosis not present

## 2016-11-16 HISTORY — PX: LAPAROSCOPY: SHX197

## 2016-11-16 HISTORY — DX: Depression, unspecified: F32.A

## 2016-11-16 HISTORY — DX: Major depressive disorder, single episode, unspecified: F32.9

## 2016-11-16 HISTORY — PX: BILATERAL SALPINGECTOMY: SHX5743

## 2016-11-16 LAB — PREGNANCY, URINE: PREG TEST UR: NEGATIVE

## 2016-11-16 SURGERY — LAPAROSCOPY OPERATIVE
Anesthesia: General | Site: Abdomen

## 2016-11-16 MED ORDER — MIDAZOLAM HCL 2 MG/2ML IJ SOLN
INTRAMUSCULAR | Status: DC | PRN
  Administered 2016-11-16: 0.5 mg via INTRAVENOUS

## 2016-11-16 MED ORDER — ONDANSETRON HCL 4 MG/2ML IJ SOLN
INTRAMUSCULAR | Status: AC
  Filled 2016-11-16: qty 2

## 2016-11-16 MED ORDER — DEXTROSE 5 % IV SOLN
3.0000 g | INTRAVENOUS | Status: AC
  Administered 2016-11-16: 3 g via INTRAVENOUS
  Filled 2016-11-16: qty 3000

## 2016-11-16 MED ORDER — LACTATED RINGERS IV SOLN
INTRAVENOUS | Status: DC
  Administered 2016-11-16 (×3): via INTRAVENOUS

## 2016-11-16 MED ORDER — PROPOFOL 10 MG/ML IV BOLUS
INTRAVENOUS | Status: AC
  Filled 2016-11-16: qty 20

## 2016-11-16 MED ORDER — MEPERIDINE HCL 25 MG/ML IJ SOLN: 6.2500 mg | INTRAMUSCULAR | Status: DC | PRN

## 2016-11-16 MED ORDER — OXYCODONE-ACETAMINOPHEN 5-325 MG PO TABS: 1.0000 | ORAL_TABLET | ORAL | 0 refills | Status: DC | PRN

## 2016-11-16 MED ORDER — MIDAZOLAM HCL 2 MG/2ML IJ SOLN
INTRAMUSCULAR | Status: AC
  Filled 2016-11-16: qty 2

## 2016-11-16 MED ORDER — HYDROMORPHONE HCL 1 MG/ML IJ SOLN: 0.2500 mg | INTRAMUSCULAR | Status: DC | PRN

## 2016-11-16 MED ORDER — OXYCODONE-ACETAMINOPHEN 5-325 MG PO TABS: 1.0000 | ORAL_TABLET | ORAL | Status: DC | PRN

## 2016-11-16 MED ORDER — OXYCODONE-ACETAMINOPHEN 5-325 MG PO TABS
ORAL_TABLET | ORAL | Status: AC
  Filled 2016-11-16: qty 1

## 2016-11-16 MED ORDER — ROCURONIUM BROMIDE 100 MG/10ML IV SOLN
INTRAVENOUS | Status: AC
  Filled 2016-11-16: qty 1

## 2016-11-16 MED ORDER — ALBUTEROL SULFATE HFA 108 (90 BASE) MCG/ACT IN AERS
INHALATION_SPRAY | RESPIRATORY_TRACT | Status: DC | PRN
  Administered 2016-11-16: 4 via RESPIRATORY_TRACT

## 2016-11-16 MED ORDER — TRIAMCINOLONE ACETONIDE 40 MG/ML IJ SUSP
40.0000 mg | Freq: Once | INTRAMUSCULAR | Status: AC
  Administered 2016-11-16: 40 mg via INTRADERMAL
  Filled 2016-11-16: qty 1

## 2016-11-16 MED ORDER — PROMETHAZINE HCL 25 MG/ML IJ SOLN: 6.2500 mg | INTRAMUSCULAR | Status: DC | PRN

## 2016-11-16 MED ORDER — NEOSTIGMINE METHYLSULFATE 10 MG/10ML IV SOLN
INTRAVENOUS | Status: AC
  Filled 2016-11-16: qty 1

## 2016-11-16 MED ORDER — SCOPOLAMINE 1 MG/3DAYS TD PT72
MEDICATED_PATCH | TRANSDERMAL | Status: AC
  Filled 2016-11-16: qty 1

## 2016-11-16 MED ORDER — ROCURONIUM BROMIDE 100 MG/10ML IV SOLN
INTRAVENOUS | Status: DC | PRN
  Administered 2016-11-16: 50 mg via INTRAVENOUS
  Administered 2016-11-16: 20 mg via INTRAVENOUS
  Administered 2016-11-16 (×2): 10 mg via INTRAVENOUS

## 2016-11-16 MED ORDER — PHENYLEPHRINE 40 MCG/ML (10ML) SYRINGE FOR IV PUSH (FOR BLOOD PRESSURE SUPPORT)
PREFILLED_SYRINGE | INTRAVENOUS | Status: AC
  Filled 2016-11-16: qty 10

## 2016-11-16 MED ORDER — GLYCOPYRROLATE 0.2 MG/ML IJ SOLN
INTRAMUSCULAR | Status: DC | PRN
  Administered 2016-11-16: 0.1 mg via INTRAVENOUS

## 2016-11-16 MED ORDER — DEXAMETHASONE SODIUM PHOSPHATE 10 MG/ML IJ SOLN
INTRAMUSCULAR | Status: AC
  Filled 2016-11-16: qty 1

## 2016-11-16 MED ORDER — SUGAMMADEX SODIUM 500 MG/5ML IV SOLN
INTRAVENOUS | Status: AC
  Filled 2016-11-16: qty 5

## 2016-11-16 MED ORDER — SUGAMMADEX SODIUM 200 MG/2ML IV SOLN
INTRAVENOUS | Status: DC | PRN
  Administered 2016-11-16: 300 mg via INTRAVENOUS

## 2016-11-16 MED ORDER — IBUPROFEN 600 MG PO TABS
ORAL_TABLET | ORAL | Status: AC
  Administered 2016-11-16: 600 mg
  Filled 2016-11-16: qty 1

## 2016-11-16 MED ORDER — ONDANSETRON HCL 4 MG/2ML IJ SOLN
INTRAMUSCULAR | Status: DC | PRN
  Administered 2016-11-16: 4 mg via INTRAVENOUS

## 2016-11-16 MED ORDER — ALBUTEROL SULFATE HFA 108 (90 BASE) MCG/ACT IN AERS
INHALATION_SPRAY | RESPIRATORY_TRACT | Status: AC
  Filled 2016-11-16: qty 6.7

## 2016-11-16 MED ORDER — BUPIVACAINE HCL (PF) 0.25 % IJ SOLN
INTRAMUSCULAR | Status: AC
  Filled 2016-11-16: qty 30

## 2016-11-16 MED ORDER — PHENYLEPHRINE HCL 10 MG/ML IJ SOLN
INTRAMUSCULAR | Status: DC | PRN
Start: 2016-11-16 — End: 2016-11-16
  Administered 2016-11-16 (×3): 40 ug via INTRAVENOUS

## 2016-11-16 MED ORDER — LIDOCAINE HCL (CARDIAC) 20 MG/ML IV SOLN
INTRAVENOUS | Status: AC
  Filled 2016-11-16: qty 5

## 2016-11-16 MED ORDER — MIDAZOLAM HCL 2 MG/2ML IJ SOLN: 0.5000 mg | Freq: Once | INTRAMUSCULAR | Status: DC | PRN

## 2016-11-16 MED ORDER — SCOPOLAMINE 1 MG/3DAYS TD PT72
1.0000 | MEDICATED_PATCH | Freq: Once | TRANSDERMAL | Status: DC
  Administered 2016-11-16: 1.5 mg via TRANSDERMAL

## 2016-11-16 MED ORDER — PROPOFOL 10 MG/ML IV BOLUS
INTRAVENOUS | Status: DC | PRN
  Administered 2016-11-16: 200 mg via INTRAVENOUS

## 2016-11-16 MED ORDER — FENTANYL CITRATE (PF) 250 MCG/5ML IJ SOLN
INTRAMUSCULAR | Status: AC
  Filled 2016-11-16: qty 5

## 2016-11-16 MED ORDER — FENTANYL CITRATE (PF) 100 MCG/2ML IJ SOLN
INTRAMUSCULAR | Status: DC | PRN
  Administered 2016-11-16 (×5): 50 ug via INTRAVENOUS

## 2016-11-16 MED ORDER — BUPIVACAINE HCL (PF) 0.25 % IJ SOLN
INTRAMUSCULAR | Status: DC | PRN
  Administered 2016-11-16: 27 mL
  Administered 2016-11-16: 10 mL
  Administered 2016-11-16: 11 mL

## 2016-11-16 MED ORDER — IBUPROFEN 600 MG PO TABS: 600.0000 mg | ORAL_TABLET | Freq: Four times a day (QID) | ORAL | 1 refills | Status: DC | PRN

## 2016-11-16 SURGICAL SUPPLY — 36 items
ADH SKN CLS APL DERMABOND .7 (GAUZE/BANDAGES/DRESSINGS)
APL SKNCLS STERI-STRIP NONHPOA (GAUZE/BANDAGES/DRESSINGS)
BAG SPEC RTRVL LRG 6X4 10 (ENDOMECHANICALS) ×2
BENZOIN TINCTURE PRP APPL 2/3 (GAUZE/BANDAGES/DRESSINGS) IMPLANT
CLOTH BEACON ORANGE TIMEOUT ST (SAFETY) ×4 IMPLANT
DERMABOND ADVANCED (GAUZE/BANDAGES/DRESSINGS)
DERMABOND ADVANCED .7 DNX12 (GAUZE/BANDAGES/DRESSINGS) ×2 IMPLANT
DRSG OPSITE POSTOP 3X4 (GAUZE/BANDAGES/DRESSINGS) ×2 IMPLANT
DURAPREP 26ML APPLICATOR (WOUND CARE) ×4 IMPLANT
FORCEPS CUTTING 33CM 5MM (CUTTING FORCEPS) IMPLANT
GLOVE BIO SURGEON STRL SZ7 (GLOVE) ×4 IMPLANT
GLOVE BIOGEL PI IND STRL 7.0 (GLOVE) ×6 IMPLANT
GLOVE BIOGEL PI INDICATOR 7.0 (GLOVE) ×6
GOWN STRL REUS W/TWL LRG LVL3 (GOWN DISPOSABLE) ×8 IMPLANT
PACK LAPAROSCOPY BASIN (CUSTOM PROCEDURE TRAY) ×4 IMPLANT
PACK TRENDGUARD 450 HYBRID PRO (MISCELLANEOUS) IMPLANT
PACK TRENDGUARD 600 HYBRD PROC (MISCELLANEOUS) IMPLANT
POUCH SPECIMEN RETRIEVAL 10MM (ENDOMECHANICALS) ×2 IMPLANT
PROTECTOR NERVE ULNAR (MISCELLANEOUS) ×8 IMPLANT
SCALPEL HARMONIC ACE (MISCELLANEOUS) ×2 IMPLANT
SET IRRIG TUBING LAPAROSCOPIC (IRRIGATION / IRRIGATOR) ×2 IMPLANT
SLEEVE XCEL OPT CAN 5 100 (ENDOMECHANICALS) ×4 IMPLANT
SUT MNCRL AB 4-0 PS2 18 (SUTURE) ×4 IMPLANT
SUT PLAIN 2 0 (SUTURE)
SUT PLAIN ABS 2-0 CT1 27XMFL (SUTURE) IMPLANT
SUT VICRYL 0 UR6 27IN ABS (SUTURE) ×4 IMPLANT
TOWEL OR 17X24 6PK STRL BLUE (TOWEL DISPOSABLE) ×8 IMPLANT
TRAY FOLEY BAG SILVER LF 16FR (CATHETERS) ×2 IMPLANT
TRAY FOLEY CATH SILVER 14FR (SET/KITS/TRAYS/PACK) ×2 IMPLANT
TRENDGUARD 450 HYBRID PRO PACK (MISCELLANEOUS)
TRENDGUARD 600 HYBRID PROC PK (MISCELLANEOUS) ×4
TROCAR 5M 150ML BLDLS (TROCAR) ×4 IMPLANT
TROCAR XCEL NON-BLD 11X100MML (ENDOMECHANICALS) ×2 IMPLANT
TROCAR XCEL NON-BLD 5MMX100MML (ENDOMECHANICALS) ×4 IMPLANT
WARMER LAPAROSCOPE (MISCELLANEOUS) ×4 IMPLANT
WATER STERILE IRR 1000ML POUR (IV SOLUTION) ×4 IMPLANT

## 2016-11-16 NOTE — Anesthesia Preprocedure Evaluation (Addendum)
Anesthesia Evaluation  Patient identified by MRN, date of birth, ID band Patient awake    Reviewed: Allergy & Precautions, NPO status , Patient's Chart, lab work & pertinent test results  History of Anesthesia Complications Negative for: history of anesthetic complications  Airway Mallampati: II  TM Distance: >3 FB Neck ROM: Full    Dental  (+) Edentulous Upper, Edentulous Lower   Pulmonary asthma (uses inhaler several times per week) ,    breath sounds clear to auscultation       Cardiovascular hypertension, Pt. on medications  Rhythm:Regular Rate:Normal     Neuro/Psych Anxiety Depression Bipolar Disorder negative neurological ROS     GI/Hepatic negative GI ROS, Neg liver ROS,   Endo/Other  Hypothyroidism Morbid obesity  Renal/GU      Musculoskeletal negative musculoskeletal ROS (+)   Abdominal (+) + obese,   Peds  Hematology  (+) Blood dyscrasia (Hb 10.0), anemia ,   Anesthesia Other Findings   Reproductive/Obstetrics negative OB ROS                           Anesthesia Physical Anesthesia Plan  ASA: III  Anesthesia Plan: General   Post-op Pain Management:    Induction: Intravenous  Airway Management Planned: Oral ETT  Additional Equipment:   Intra-op Plan:   Post-operative Plan: Extubation in OR  Informed Consent: I have reviewed the patients History and Physical, chart, labs and discussed the procedure including the risks, benefits and alternatives for the proposed anesthesia with the patient or authorized representative who has indicated his/her understanding and acceptance.   Consent reviewed with POA  Plan Discussed with: CRNA and Surgeon  Anesthesia Plan Comments: (Plan routine monitors GETA Risks/benefits reviewed with patient and mother)       Anesthesia Quick Evaluation

## 2016-11-16 NOTE — Interval H&P Note (Signed)
History and Physical Interval Note:  11/16/2016 1:13 PM  Suzanne Jacobs  has presented today for surgery, with the diagnosis of Left Ovarian/Adnexal Mass  The various methods of treatment have been discussed with the patient and family. After consideration of risks, benefits and other options for treatment, the patient has consented to  Procedure(s): LAPAROSCOPY OPERATIVE (N/A) as a surgical intervention .  The patient's history has been reviewed, patient examined, no change in status, stable for surgery.  I have reviewed the patient's chart and labs.  Questions were answered to the patient's satisfaction.     Dion BodyVARNADO, Ashtyn Freilich

## 2016-11-16 NOTE — Transfer of Care (Signed)
Immediate Anesthesia Transfer of Care Note  Patient: Suzanne Jacobs  Procedure(s) Performed: Procedure(s): OPERATIVE LAPAROSCOPY, REMOVAL LEFT PARATUBAL CYST (N/A) BILATERAL SALPINGECTOMY (Bilateral)  Patient Location: PACU  Anesthesia Type:General  Level of Consciousness: awake, alert  and oriented  Airway & Oxygen Therapy: Patient Spontanous Breathing and Patient connected to nasal cannula oxygen  Post-op Assessment: Report given to RN and Post -op Vital signs reviewed and stable  Post vital signs: Reviewed and stable  Last Vitals:  Vitals:   11/16/16 1215  BP: (!) 111/91  Pulse: 95  Resp: 18  Temp: 36.4 C    Last Pain:  Vitals:   11/16/16 1215  TempSrc: Oral      Patients Stated Pain Goal: 3 (11/16/16 1215)  Complications: No apparent anesthesia complications

## 2016-11-16 NOTE — Brief Op Note (Signed)
11/16/2016  5:09 PM  PATIENT:  Suzanne Jacobs  50 y.o. female  PRE-OPERATIVE DIAGNOSIS:  Left Ovarian Cyst  POST-OPERATIVE DIAGNOSIS:  Left paratubal cyst (torsed), extensive pelvic and abdominal adhesions  PROCEDURE:  Procedure(s): OPERATIVE LAPAROSCOPY, REMOVAL LEFT PARATUBAL CYST (N/A) BILATERAL SALPINGECTOMY (Bilateral)  SURGEON:  Surgeon(s) and Role:    * Geryl RankinsEvelyn Yvana Samonte, MD - Primary    * Hoover BrownsEma Kulwa, MD - Assisting  PHYSICIAN ASSISTANT:   ASSISTANTS: Technician   ANESTHESIA:   local and general  EBL:  Total I/O In: 2200 [I.V.:2200] Out: 225 [Urine:200; Blood:25]  BLOOD ADMINISTERED:none  DRAINS: Urinary Catheter (Foley)   LOCAL MEDICATIONS USED:  MARCAINE    and OTHER Kenalog  SPECIMEN:  Source of Specimen:  Left paratubal cyst with adhered fallopian tube, right fallopian tube, small portion of left fallopian tube, pelvic washings  DISPOSITION OF SPECIMEN:  PATHOLOGY  COUNTS:  YES  TOURNIQUET:  * No tourniquets in log *  DICTATION: .Other Dictation: Dictation Number [id number]  PLAN OF CARE: Discharge to home after PACU  PATIENT DISPOSITION:  PACU - hemodynamically stable.   Delay start of Pharmacological VTE agent (>24hrs) due to surgical blood loss or risk of bleeding: not applicable

## 2016-11-16 NOTE — Anesthesia Procedure Notes (Signed)
Procedure Name: Intubation Date/Time: 11/16/2016 1:51 PM Performed by: Graciela HusbandsFUSSELL, Berle Fitz O Pre-anesthesia Checklist: Suction available, Emergency Drugs available, Timeout performed, Patient being monitored and Patient identified Patient Re-evaluated:Patient Re-evaluated prior to inductionOxygen Delivery Method: Circle system utilized Preoxygenation: Pre-oxygenation with 100% oxygen Intubation Type: IV induction Ventilation: Mask ventilation without difficulty and Oral airway inserted - appropriate to patient size Grade View: Grade I Tube type: Oral Tube size: 7.0 mm Number of attempts: 1 Airway Equipment and Method: Patient positioned with wedge pillow and Stylet Placement Confirmation: ETT inserted through vocal cords under direct vision,  positive ETCO2 and breath sounds checked- equal and bilateral Secured at: 21.5 cm Tube secured with: Tape Dental Injury: Teeth and Oropharynx as per pre-operative assessment

## 2016-11-16 NOTE — Anesthesia Postprocedure Evaluation (Signed)
Anesthesia Post Note  Patient: Suzanne EarthlyDoris R Jacobs  Procedure(s) Performed: Procedure(s) (LRB): OPERATIVE LAPAROSCOPY, REMOVAL LEFT PARATUBAL CYST (N/A) BILATERAL SALPINGECTOMY (Bilateral)  Patient location during evaluation: PACU Anesthesia Type: General Level of consciousness: awake and alert Pain management: pain level controlled Vital Signs Assessment: post-procedure vital signs reviewed and stable Respiratory status: spontaneous breathing, nonlabored ventilation, respiratory function stable and patient connected to nasal cannula oxygen Cardiovascular status: blood pressure returned to baseline and stable Postop Assessment: no signs of nausea or vomiting Anesthetic complications: no     Last Vitals:  Vitals:   11/16/16 1730 11/16/16 1745  BP: 129/79   Pulse: 96   Resp: 13   Temp:  36.7 C    Last Pain:  Vitals:   11/16/16 1730  TempSrc:   PainSc: 0-No pain   Pain Goal: Patients Stated Pain Goal: 3 (11/16/16 1730)               Phillips Groutarignan, Laporcha Marchesi

## 2016-11-16 NOTE — Discharge Instructions (Addendum)
Diagnostic Laparoscopy, Care After Introduction Refer to this sheet in the next few weeks. These instructions provide you with information about caring for yourself after your procedure. Your health care provider may also give you more specific instructions. Your treatment has been planned according to current medical practices, but problems sometimes occur. Call your health care provider if you have any problems or questions after your procedure. What can I expect after the procedure? After your procedure, it is common to have mild discomfort in the throat and abdomen. Follow these instructions at home:  Take over-the-counter and prescription medicines only as told by your health care provider.  Do not drive for 24 hours if you received a sedative.  Return to your normal activities as told by your health care provider.  Do not take baths, swim, or use a hot tub until your health care provider approves. You may shower.  Follow instructions from your health care provider about how to take care of your incision. Make sure you:  Wash your hands with soap and water before you change your bandage (dressing). If soap and water are not available, use hand sanitizer.  Change your dressing as told by your health care provider.  Leave stitches (sutures), skin glue, or adhesive strips in place. These skin closures may need to stay in place for 2 weeks or longer. If adhesive strip edges start to loosen and curl up, you may trim the loose edges. Do not remove adhesive strips completely unless your health care provider tells you to do that.  Check your incision area every day for signs of infection. Check for:  More redness, swelling, or pain.  More fluid or blood.  Warmth.  Pus or a bad smell.  It is your responsibility to get the results of your procedure. Ask your health care provider or the department performing the procedure when your results will be ready. Contact a health care provider  if:  There is new pain in your shoulders.  You feel light-headed or faint.  You are unable to pass gas or unable to have a bowel movement.  You feel nauseous or you vomit.  You develop a rash.  You have more redness, swelling, or pain around your incision.  You have more fluid or blood coming from your incision.  Your incision feels warm to the touch.  You have pus or a bad smell coming from your incision.  You have a fever or chills. Get help right away if:  Your pain is getting worse.  You have ongoing vomiting.  The edges of your incision open up.  You have trouble breathing.  You have chest pain. This information is not intended to replace advice given to you by your health care provider. Make sure you discuss any questions you have with your health care provider. Document Released: 11/18/2015 Document Revised: 05/14/2016 Document Reviewed: 08/20/2015  Post Anesthesia Home Care Instructions  Activity: Get plenty of rest for the remainder of the day. A responsible adult should stay with you for 24 hours following the procedure.  For the next 24 hours, DO NOT: -Drive a car -Advertising copywriterperate machinery -Drink alcoholic beverages -Take any medication unless instructed by your physician -Make any legal decisions or sign important papers.  Meals: Start with liquid foods such as gelatin or soup. Progress to regular foods as tolerated. Avoid greasy, spicy, heavy foods. If nausea and/or vomiting occur, drink only clear liquids until the nausea and/or vomiting subsides. Call your physician if vomiting continues.  Special Instructions/Symptoms: Your throat may feel dry or sore from the anesthesia or the breathing tube placed in your throat during surgery. If this causes discomfort, gargle with warm salt water. The discomfort should disappear within 24 hours.  If you had a scopolamine patch placed behind your ear for the management of post- operative nausea and/or vomiting:  1.  The medication in the patch is effective for 72 hours, after which it should be removed.  Wrap patch in a tissue and discard in the trash. Wash hands thoroughly with soap and water. 2. You may remove the patch earlier than 72 hours if you experience unpleasant side effects which may include dry mouth, dizziness or visual disturbances. 3. Avoid touching the patch. Wash your hands with soap and water after contact with the patch.    2017 [employer]

## 2016-11-17 ENCOUNTER — Encounter (HOSPITAL_COMMUNITY): Payer: Self-pay | Admitting: Obstetrics and Gynecology

## 2016-11-17 NOTE — Op Note (Signed)
NAMMayford Knife:  Jacobs, Suzanne               ACCOUNT NO.:  [id number]  MEDICAL RECORD NO.:  [id number]  LOCATION:  PERIO                         FACILITY:  [facility]  PHYSICIAN:  Pieter PartridgeEvelyn B Isabela Nardelli, MD   DATE OF BIRTH:  February 02, 1966  DATE OF PROCEDURE:  11/16/2016 DATE OF DISCHARGE:                              OPERATIVE REPORT   PREOPERATIVE DIAGNOSIS:  Left ovarian cyst.  POSTOPERATIVE DIAGNOSES:  Left paratubal cyst (torsed) and extensive pelvic and abdominal adhesions.  PROCEDURE:  Operative laparotomy removal of left paratubal cyst and bilateral salpingectomy.  SURGEON:  Pieter PartridgeEvelyn B Asa Fath, MD.  ASSISTANTS:  Dr. Hoover BrownsEma Kulwa which was necessary due to the large nature of the cyst and the patient's body habitus.  Assistant also Pensions consultanttechnician.  ANESTHESIA:  Local and general.  EBL:  25 mL.  URINE:  200 clear.  DRAINS:  Foley catheter.  Local is Marcaine and Kenalog 40 mg in 1 mL.  SPECIMEN:  Left paratubal cyst with adhered fallopian tube, right fallopian tube and small portion of left fallopian tube, and pelvic washings.  DISPOSITION:  Specimen is to Pathology.  COUNTS:  Correct.  PATIENT DISPOSITION:  To PACU, hemodynamically stable.  COMPLICATIONS:  None.  FINDINGS:  Dense abdominal omental adhesions to the anterior abdominal wall up to the umbilicus.  The cyst was clear fluid and appear to be too large cyst encasing the fallopian tube.  Simple in appearance.  The scarring of the left adnexa and cornua of the uterus to the left pelvic side wall.  Bowel adhesions to the left pelvic sidewall as well as the right pelvic side wall.  Difficult to see in the cul-de-sac, but the anterior cul-de-sac had a very thick band from the suprapubic area to the anterior cul-de-sac and anterior uterus, unable to see liver edge due to omental adhesions.  PROCEDURE:  Suzanne Jacobs was identified in the holding area.  She was then taken to the operating room with IV running.  She underwent  general endotracheal anesthesia without complication.  She was placed in the dorsal lithotomy position.  She was then prepped.  I did perform a bimanual exam under anesthesia to palpate the apex of the cyst in order to plan my umbilical port placement.  The cyst was about 2 fingerbreadths below the umbilicus.  So due to the position of the cyst and also patient had a lot of umbilical debris, I went about 2-3 cm above the umbilicus.  The patient was prepped and I then advanced a Hulka uterine manipulator through the endocervix after single-tooth tenaculum was used to grasp the anterior lip of the cervix.  The anterior tenaculum was then removed.  The patient was menstruating and there was a moderate amount of dark blood from the vagina to the perineum.  The patient was then draped.  The umbilical incision was marked. 0.25% Marcaine was injected and with a 10 mm scope and that was advanced under direct visualization.  Initially, I felt like we were, had gone through the omentum and insufflated; however, we were later learned that it was a scar to the anterior abdominal wall.  I attempted to remove the trocar and come back through to release the  omentum, but again it did not fall into the bowel.  At that point, it was difficult to see.  We wanted to place our lower ports, but we could not see epigastric due to patient's obesity nor could we see where our needle was coming out.  We did use a spinal needle to come through the abdomen, which was barely visible due to the depth of the abdomen.  After long consideration we then did a left upper quadrant port.  Midclavicular line was determined and the abdomen was already insufflated so we put a 5-mm trocar without complication.  We were then able to see the left pelvic sidewall free and clear.  We were able to go down and around the omentum, but it kind of hung in the visual field that we pulled back.  Eventually we would be able to place 2 5  mm trocar ports in the lower abdomen under direct visualization.  On the left side, we were able to transilluminate slightly and see the vessels, but we could not see where we were coming and that was placed atraumatically under direct visualization.  The same was done on the right side.  Marcaine was injected prior to placing the trocars. We would use all of the ports to help to optimize visualization.  The atraumatic graspers were used to untwist the cyst so that a stalk could be identified.  The ovary was not involved in the cyst at all and there was a very easy plane to transect the fallopian tube and remove the cyst that was down with the Harmonic Scalpel with advanced hemostasis.  There was minimal bleeding noted.  We were unable to untwist the cyst entirely prior to removing it.  Once it was removed they did remain a small portion of the tube that had been in the torsed part and that we would eventually go back and remove with the Harmonic.  Once the cyst was amputated from the fallopian tube and the ovary.  The right fallopian tube was grasped with a grasper and was transected with the Harmonic Scalpel without and any difficulty.  There was a stump of a fallopian tube at the cornu which is consistent with patient's previous history of a tubal ligation.  We then placed the EndoCatch through the 10 mm infraumbilical port.  The cyst had gone into the upper abdomen that was regrasped and brought to the pelvis and the fallopian tube and the cyst were placed in the EndoCatch that was brought up to the abdomen.  The fascial incision was extended to a small mini lap, we were able to bring the specimen to the abdomen and then we drained the cyst in order to remove it entirely.  Once the specimen was removed.  There was a small portion of the left fallopian tube.  That was retrieved.  Then removed through the 10 mm port.  All other ports were removed under direct visualization.   The pedicles were inspected for bleeding prior to that.  The fascia was then closed with 0 Vicryl, UR-6 needle in a continuous running fashion.  There was not a lot of active bleeding.  My concern was that the omentum might be behind that was thoroughly inspected and there was nothing active.  Kenalog and Marcaine were then mixed and all of the incisions were injected.  The umbilical incision was closed with 4-0 Monocryl in a subcuticular fashion.  Prior to that, and the 2 lower ports were closed with Monocryl subcu.  All incisions again were injected with Kenalog and Marcaine mixture.  The uterine manipulator was then removed from the vagina.  All instrument, sponge, and needle counts were correct x3.  Patient received Ancef 3 g IV prior to the procedure.  SCDs were on and operating she tolerated the procedure well.     Pieter PartridgeEvelyn B Suzanne Coupe, MD     EBV/MEDQ  D:  11/16/2016  T:  11/17/2016  Job:  [id number]

## 2017-10-06 ENCOUNTER — Encounter (HOSPITAL_COMMUNITY): Payer: Self-pay

## 2017-10-06 ENCOUNTER — Emergency Department (HOSPITAL_COMMUNITY)
Admission: EM | Admit: 2017-10-06 | Discharge: 2017-10-06 | Disposition: A | Discharge status: Home / Self Care | Payer: Medicare Other | Attending: Emergency Medicine | Admitting: Emergency Medicine

## 2017-10-06 ENCOUNTER — Emergency Department (HOSPITAL_COMMUNITY): Payer: Medicare Other

## 2017-10-06 DIAGNOSIS — M5432 Sciatica, left side: Secondary | ICD-10-CM

## 2017-10-06 DIAGNOSIS — N3000 Acute cystitis without hematuria: Secondary | ICD-10-CM | POA: Insufficient documentation

## 2017-10-06 DIAGNOSIS — Z79899 Other long term (current) drug therapy: Secondary | ICD-10-CM | POA: Insufficient documentation

## 2017-10-06 DIAGNOSIS — M48061 Spinal stenosis, lumbar region without neurogenic claudication: Secondary | ICD-10-CM

## 2017-10-06 DIAGNOSIS — J45909 Unspecified asthma, uncomplicated: Secondary | ICD-10-CM | POA: Diagnosis not present

## 2017-10-06 DIAGNOSIS — I10 Essential (primary) hypertension: Secondary | ICD-10-CM | POA: Diagnosis not present

## 2017-10-06 DIAGNOSIS — Z91010 Allergy to peanuts: Secondary | ICD-10-CM | POA: Diagnosis not present

## 2017-10-06 DIAGNOSIS — M4807 Spinal stenosis, lumbosacral region: Secondary | ICD-10-CM | POA: Insufficient documentation

## 2017-10-06 DIAGNOSIS — E039 Hypothyroidism, unspecified: Secondary | ICD-10-CM | POA: Insufficient documentation

## 2017-10-06 DIAGNOSIS — M549 Dorsalgia, unspecified: Secondary | ICD-10-CM | POA: Diagnosis present

## 2017-10-06 HISTORY — DX: Dorsalgia, unspecified: M54.9

## 2017-10-06 LAB — BASIC METABOLIC PANEL
Anion gap: 8 (ref 5–15)
BUN: 11 mg/dL (ref 6–20)
CALCIUM: 9 mg/dL (ref 8.9–10.3)
CO2: 27 mmol/L (ref 22–32)
CREATININE: 1.01 mg/dL — AB (ref 0.44–1.00)
Chloride: 102 mmol/L (ref 101–111)
Glucose, Bld: 87 mg/dL (ref 65–99)
Potassium: 4.3 mmol/L (ref 3.5–5.1)
SODIUM: 137 mmol/L (ref 135–145)

## 2017-10-06 LAB — URINALYSIS, ROUTINE W REFLEX MICROSCOPIC
BILIRUBIN URINE: NEGATIVE
GLUCOSE, UA: NEGATIVE mg/dL
Hgb urine dipstick: NEGATIVE
Ketones, ur: NEGATIVE mg/dL
NITRITE: POSITIVE — AB
PH: 5 (ref 5.0–8.0)
Protein, ur: NEGATIVE mg/dL
Specific Gravity, Urine: 1.015 (ref 1.005–1.030)

## 2017-10-06 LAB — CBC
HCT: 32.2 % — ABNORMAL LOW (ref 36.0–46.0)
HEMOGLOBIN: 10.3 g/dL — AB (ref 12.0–15.0)
MCH: 24.5 pg — ABNORMAL LOW (ref 26.0–34.0)
MCHC: 32 g/dL (ref 30.0–36.0)
MCV: 76.5 fL — ABNORMAL LOW (ref 78.0–100.0)
PLATELETS: 356 10*3/uL (ref 150–400)
RBC: 4.21 MIL/uL (ref 3.87–5.11)
RDW: 16.9 % — AB (ref 11.5–15.5)
WBC: 11.2 10*3/uL — ABNORMAL HIGH (ref 4.0–10.5)

## 2017-10-06 MED ORDER — HYDROMORPHONE HCL 1 MG/ML IJ SOLN
1.0000 mg | Freq: Once | INTRAMUSCULAR | Status: DC
  Filled 2017-10-06: qty 1

## 2017-10-06 MED ORDER — HYDROCODONE-ACETAMINOPHEN 5-325 MG PO TABS: 1.0000 | ORAL_TABLET | ORAL | 0 refills | Status: DC | PRN

## 2017-10-06 MED ORDER — HYDROMORPHONE HCL 1 MG/ML IJ SOLN
1.0000 mg | Freq: Once | INTRAMUSCULAR | Status: AC
  Administered 2017-10-06: 1 mg via INTRAMUSCULAR

## 2017-10-06 MED ORDER — CEPHALEXIN 500 MG PO CAPS
500.0000 mg | ORAL_CAPSULE | Freq: Once | ORAL | Status: AC
  Administered 2017-10-06: 500 mg via ORAL
  Filled 2017-10-06: qty 1

## 2017-10-06 MED ORDER — CEPHALEXIN 250 MG PO CAPS: 250.0000 mg | ORAL_CAPSULE | Freq: Four times a day (QID) | ORAL | 0 refills | Status: DC

## 2017-10-06 NOTE — ED Triage Notes (Signed)
Per EMS- Patient has chronic back pain. Back pain became worse and [facility] Patient reported to them that she began having incontinence of urine last night. [facility] called EMS for incontinence of urine and worsening back pain.

## 2017-10-06 NOTE — Discharge Instructions (Signed)
Take the antibiotics for the urinary tract infection.  Schedule an appointment with the neurosurgeon for further evaluation and treatment of your back problems. Take the Medications as needed for pain.

## 2017-10-06 NOTE — ED Provider Notes (Signed)
[facility]-EMERGENCY DEPT Provider Note   CSN: [id number] Arrival date & time: 10/06/17  1358     History   Chief Complaint Chief Complaint  Patient presents with  . Back Pain  . Urinary Incontinence    HPI Suzanne Jacobs is a 51 y.o. female.  HPI Pt presents to the ED with complaints of back pain. Sx started a few weeks ago. The pain is in the lower back radiation to her left leg.  The pain has been increasing in severity.  She has some numbness in her left leg.  It hurts for her to put pressure on her leg but her leg does not feel weak.   She had an episode of incontinence but that was because she could not get to the bathroom because of the pain, not that she could not feel that she was urinating.  She went to her doctor today and was sent to the ED for further evaluation. Past Medical History:  Diagnosis Date  . Anxiety   . Asthma   . Back pain   . Depression   . Dyspnea   . Dysrhythmia   . History of MRSA infection   . Hypertension   . Hypothyroidism   . PONV (postoperative nausea and vomiting)     Patient Active Problem List   Diagnosis Date Noted  . DYSPNEA 12/02/2009  . ANEMIA 08/29/2008  . HYPOTHYROIDISM 08/17/2008  . MORBID OBESITY 08/17/2008  . BIPOLAR DISORDER UNSPECIFIED 08/17/2008  . CELLULITIS AND ABSCESS OF LEG EXCEPT FOOT 08/17/2008  . 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 BILATERAL SALPINGECTOMY Bilateral 11/16/2016   Procedure: BILATERAL SALPINGECTOMY;  Surgeon: Geryl RankinsEvelyn Varnado, MD;  Location: [facility] ORS;  Service: Gynecology;  Laterality: Bilateral;  . CESAREAN SECTION    . CYSTECTOMY    . HERNIA REPAIR    . HYSTEROSCOPY W/D&C N/A 08/08/2013   Procedure: DILATATION AND CURETTAGE /HYSTEROSCOPY;  Surgeon: Dorien Chihuahuaara J. Richardson Doppole, MD;  Location: [facility] ORS;  Service: Gynecology;  Laterality: N/A;  . LAPAROSCOPY N/A 11/16/2016   Procedure: OPERATIVE LAPAROSCOPY, REMOVAL LEFT PARATUBAL CYST;  Surgeon: Geryl RankinsEvelyn  Varnado, MD;  Location: [facility] ORS;  Service: Gynecology;  Laterality: N/A;  . TUBAL LIGATION      OB History    Gravida Para Term Preterm AB Living   1 1       1    SAB TAB Ectopic Multiple Live Births                   Home Medications    Prior to Admission medications   Medication Sig Start Date End Date Taking? Authorizing Provider  acetaminophen (TYLENOL) 325 MG tablet Take 325 mg by mouth every 6 (six) hours as needed for moderate pain.    Yes [provider]  FLUoxetine (PROZAC) 40 MG capsule TK ONE C PO QAM 08/12/17  Yes [provider]  lamoTRIgine (LAMICTAL) 150 MG tablet TK 1 T PO QAM 09/07/17  Yes [provider]  levothyroxine (SYNTHROID, LEVOTHROID) 50 MCG tablet Take 50 mcg by mouth daily before breakfast.    Yes [provider]  lisinopril (PRINIVIL,ZESTRIL) 20 MG tablet Take 20 mg by mouth every morning.   Yes [provider]  QUEtiapine (SEROQUEL XR) 300 MG 24 hr tablet Take 300 mg by mouth at bedtime.   Yes [provider]  traZODone (DESYREL) 150 MG tablet Take 150 mg by mouth at bedtime.   Yes [provider]  albuterol (PROVENTIL HFA;VENTOLIN HFA) 108 (90 BASE) MCG/ACT inhaler Inhale 2 puffs into the lungs every 2 (two) hours as needed for wheezing or shortness of breath (cough). 11/17/15   Cartner, Sharlet Salina, PA-C  cephALEXin (KEFLEX) 250 MG capsule Take 1 capsule (250 mg total) by mouth 4 (four) times daily. 10/06/17   Linwood Dibbles, MD  HYDROcodone-acetaminophen (NORCO/VICODIN) 5-325 MG tablet Take 1 tablet by mouth every 4 (four) hours as needed. 10/06/17   Linwood Dibbles, MD  ibuprofen (ADVIL,MOTRIN) 600 MG tablet Take 1 tablet (600 mg total) by mouth every 6 (six) hours as needed. Patient not taking: Reported on 10/06/2017 11/16/16   Geryl Rankins, MD  oxyCODONE-acetaminophen (PERCOCET/ROXICET) 5-325 MG tablet Take 1-2 tablets by mouth every 4 (four) hours as needed for severe pain. Patient not taking:  Reported on 10/06/2017 11/16/16   Geryl Rankins, MD    Family History Family History  Problem Relation Age of Onset  . Hypertension Mother   . Cancer Other     Social History Social History  Substance Use Topics  . Smoking status: Never Smoker  . Smokeless tobacco: Never Used  . Alcohol use No     Allergies   Peanut-containing drug products; Sulfa antibiotics; and Sulfamethoxazole-trimethoprim   Review of Systems Review of Systems  All other systems reviewed and are negative.    Physical Exam Updated Vital Signs BP 134/72 (BP Location: Right Arm)   Pulse 69   Temp 98 F (36.7 C) (Oral)   Resp 16   Ht 1.524 m (5')   Wt 131.5 kg (290 lb)   LMP 10/05/2017   SpO2 100%   BMI 56.64 kg/m   Physical Exam  Constitutional: She appears well-developed and well-nourished.  HENT:  Head: Normocephalic and atraumatic.  Right Ear: External ear normal.  Left Ear: External ear normal.  Nose: Nose normal.  Eyes: Conjunctivae and EOM are normal.  Neck: Neck supple. No tracheal deviation present.  Cardiovascular: Normal rate and regular rhythm.   Pulmonary/Chest: Effort normal. No stridor. No respiratory distress.  Musculoskeletal: She exhibits no edema or tenderness.       Lumbar back: She exhibits decreased range of motion, pain and spasm. She exhibits no swelling and no edema.  Neurological: She is alert. She is not disoriented. A sensory deficit is present. No cranial nerve deficit. She exhibits normal muscle tone. Coordination normal.  Pain with movement of the left leg, 5/5 plantar and dorsi flexion strength., decreased sensation left lower leg  Skin: Skin is warm and dry. No rash noted. She is not diaphoretic. No erythema.  Psychiatric: She has a normal mood and affect. Her behavior is normal. Thought content normal.  Nursing note and vitals reviewed.    ED Treatments / Results  Labs (all labs ordered are listed, but only abnormal results are displayed) Labs  Reviewed  CBC - Abnormal; Notable for the following:       Result Value   WBC 11.2 (*)    Hemoglobin 10.3 (*)    HCT 32.2 (*)    MCV 76.5 (*)    MCH 24.5 (*)    RDW 16.9 (*)    All other components within normal limits  BASIC METABOLIC PANEL - Abnormal; Notable for the following:    Creatinine, Ser 1.01 (*)    All other components within normal limits  URINALYSIS, ROUTINE W REFLEX MICROSCOPIC - Abnormal; Notable for the following:    Nitrite POSITIVE (*)    Leukocytes, UA MODERATE (*)  Bacteria, UA MANY (*)    Squamous Epithelial / LPF 0-5 (*)    All other components within normal limits     Radiology Mr Lumbar Spine Wo Contrast  Result Date: 10/06/2017 CLINICAL DATA:  Back pain, rapidly progressive neuro deficit EXAM: MRI LUMBAR SPINE WITHOUT CONTRAST TECHNIQUE: Multiplanar, multisequence MR imaging of the lumbar spine was performed. No intravenous contrast was administered. COMPARISON:  Lumbar MRI 08/18/2012 FINDINGS: Image quality degraded by morbid obesity and mild motion. Segmentation:  Normal Alignment:  Slight anterolisthesis L4-5 Vertebrae:  Negative for fracture or mass. Conus medullaris: Extends to the L1 level and appears normal. Paraspinal and other soft tissues: Negative Disc levels: L1-2:  Mild disc degeneration without stenosis L2-3:  Mild disc and facet degeneration without significant stenosis L3-4: Diffuse disc bulging and moderate facet hypertrophy causing mild to moderate spinal stenosis. This has progressed since 2013 L4-5: Progressive disc degeneration and disc bulging. Progressive severe facet degeneration. Severe spinal stenosis has progressed significantly since 2013. Neural foramina patent L5-S1: Bilateral facet degeneration without significant spinal stenosis. 1 cm synovial cyst posterior to the facet joint on the right. IMPRESSION: Progressive degenerative change at L3-4 with mild to moderate spinal stenosis Severe spinal stenosis L4-5 with progression since  2013 Electronically Signed   By: Marlan Palau M.D.   On: 10/06/2017 19:05    Procedures Procedures (including critical care time)  Medications Ordered in ED Medications  HYDROmorphone (DILAUDID) injection 1 mg (1 mg Intravenous Not Given 10/06/17 2023)  cephALEXin (KEFLEX) capsule 500 mg (not administered)  HYDROmorphone (DILAUDID) injection 1 mg (not administered)     Initial Impression / Assessment and Plan / ED Course  I have reviewed the triage vital signs and the nursing notes.  Pertinent labs & imaging results that were available during my care of the patient were reviewed by me and considered in my medical decision making (see chart for details).  Clinical Course as of Oct 06 2029  Wed Oct 06, 2017  2028 No opiates since NOv 2017  [JK]    Clinical Course User Index [JK] Linwood Dibbles, MD    Patient presented to the emergency room evaluation of persistent lower back pain. She had an episode of urinary incontinence last night but when I spoke to her it sounds like she was just having pain and could not get to the bathroom in time.Marland Kitchen  MRI was performed to rule out cauda equina or other acute neurosurgical emergency. It does showfindings that would contribute to her sciatica-type symptoms however there are no findings of an acute neurosurgical process.Patient's urinalysis does suggest urinary tract infection. Plan on discharge home with antibiotics and pain medications. Follow up with neurosurgery.  Final Clinical Impressions(s) / ED Diagnoses   Final diagnoses:  Sciatica, left side  Spinal stenosis of lumbar region without neurogenic claudication  Acute cystitis without hematuria    New Prescriptions New Prescriptions   CEPHALEXIN (KEFLEX) 250 MG CAPSULE    Take 1 capsule (250 mg total) by mouth 4 (four) times daily.   HYDROCODONE-ACETAMINOPHEN (NORCO/VICODIN) 5-325 MG TABLET    Take 1 tablet by mouth every 4 (four) hours as needed.     Linwood Dibbles, MD 10/06/17 2032

## 2018-07-27 IMAGING — CT CT ABD-PELV W/ CM
3 of 5 series · 12 of 36 positions shown, 18 images · IV contrast (READICAT/WATER & [ID] ISOVUE 300)
Comparison: 09/09/2016

CLINICAL DATA: Followup ovarian mass

EXAM:
CT ABDOMEN AND PELVIS WITH CONTRAST
TECHNIQUE: Multidetector CT imaging of the abdomen and pelvis was performed
using the standard protocol following bolus administration of
intravenous contrast.
CONTRAST:  125mL OGBV2A-PPP IOPAMIDOL (OGBV2A-PPP) INJECTION 61%

[Series 3: abd/pelvis with · axial · 0.82mm/px · z∈[-349,+36]mm · 8 of 100 slices shown, 13 images]
[im 12/100  soft-tissue]
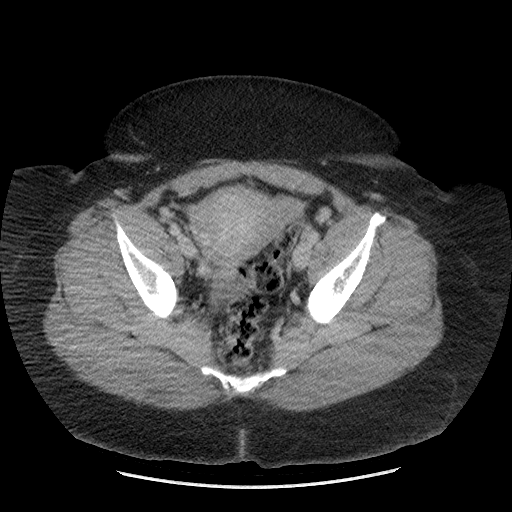
[im 12/100  bone]
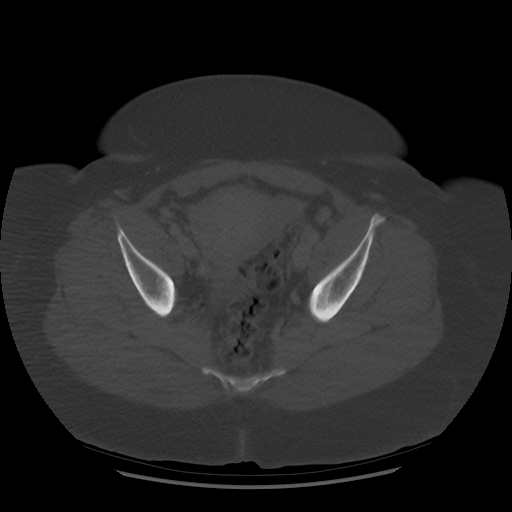
[im 23/100  soft-tissue]
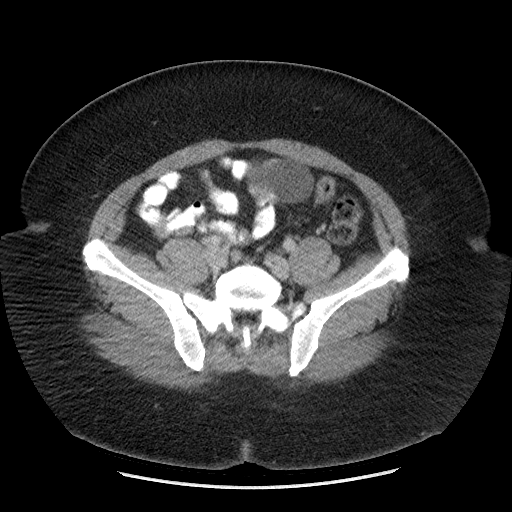
[im 34/100  soft-tissue]
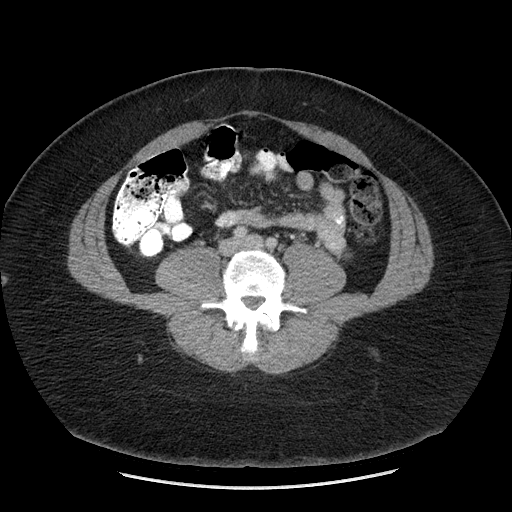
[im 45/100  soft-tissue]
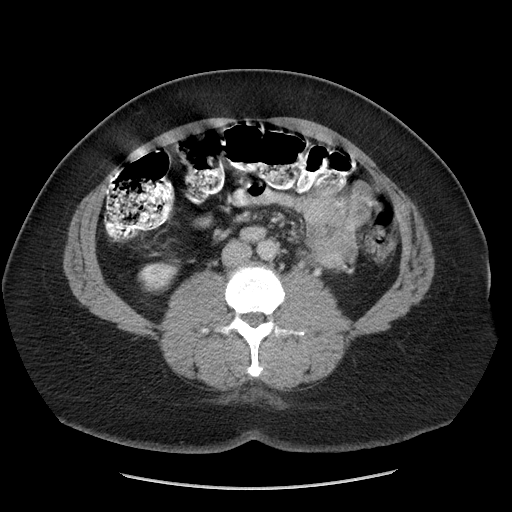
[im 56/100  soft-tissue]
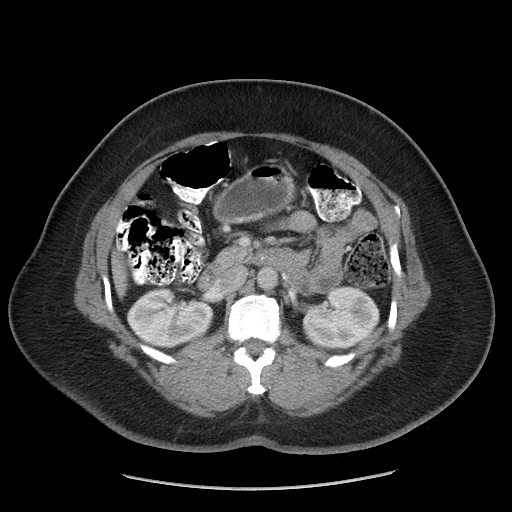
[im 56/100  lung]
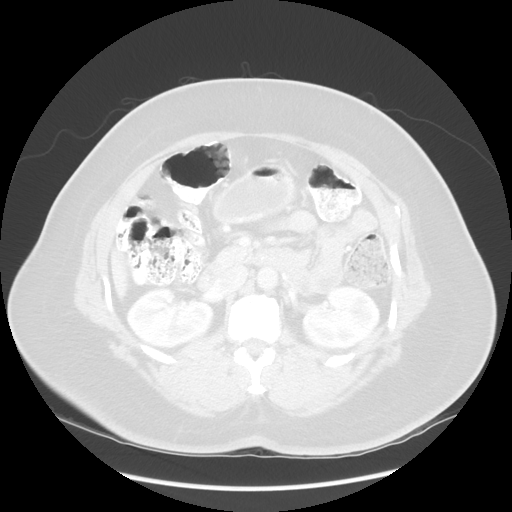
[im 67/100  soft-tissue]
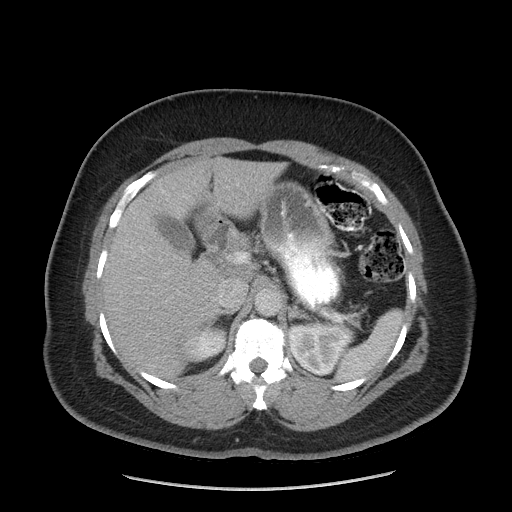
[im 67/100  lung]
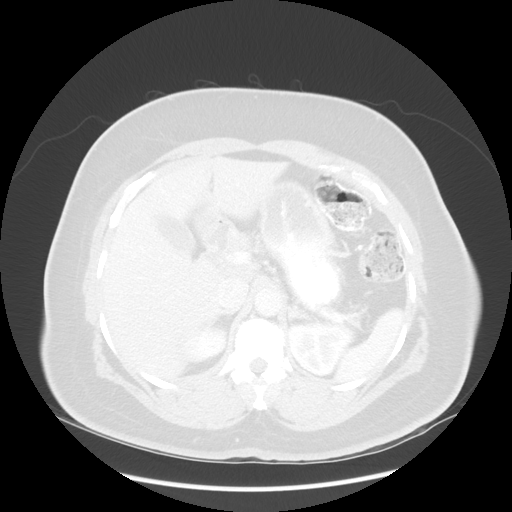
[im 78/100  soft-tissue]
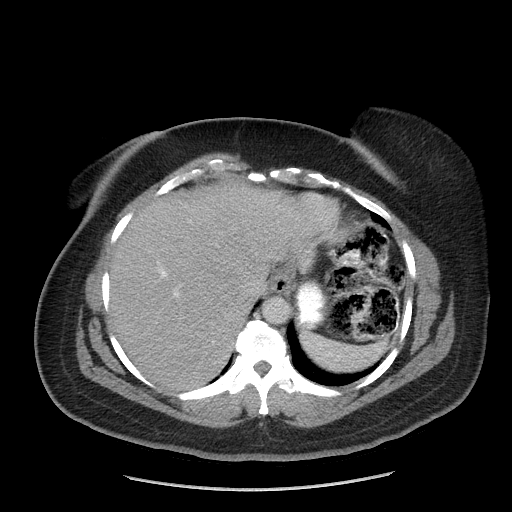
[im 78/100  lung]
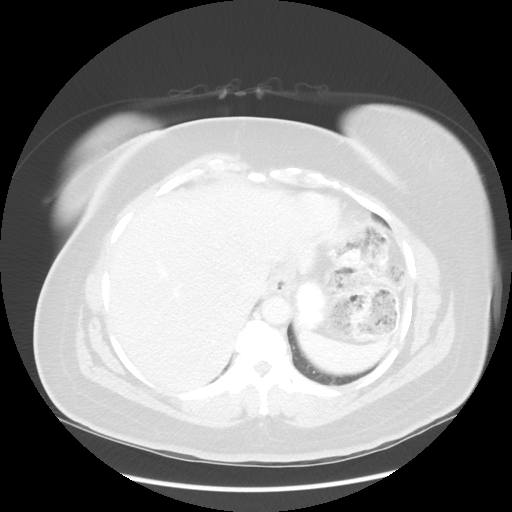
[im 89/100  soft-tissue]
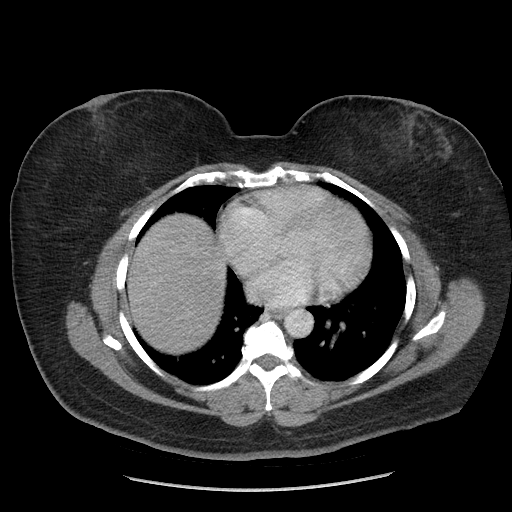
[im 89/100  lung]
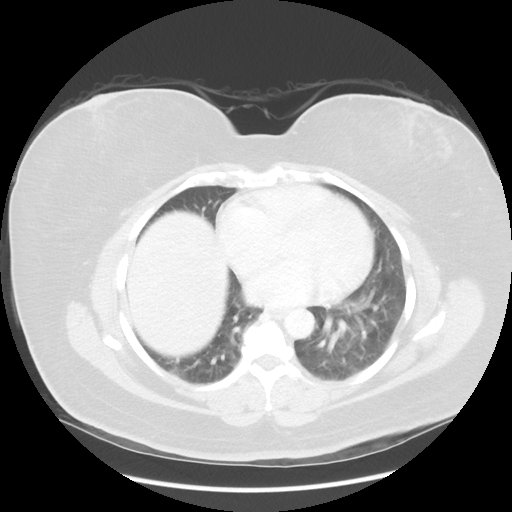

[Series 601: coronal body · coronal · 1.07mm/px · 1 of 139 slices shown, 2 images]
[im 47/139  soft-tissue]
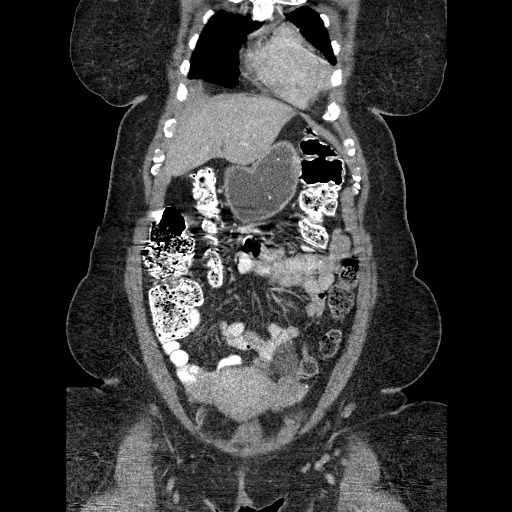
[im 47/139  bone]
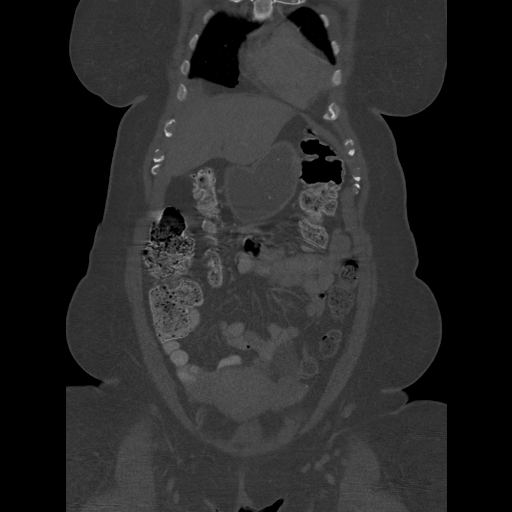

[Series 602: sagittal body · sagittal · 1.07mm/px · 3 of 169 slices shown]
[im 11/169  soft-tissue]
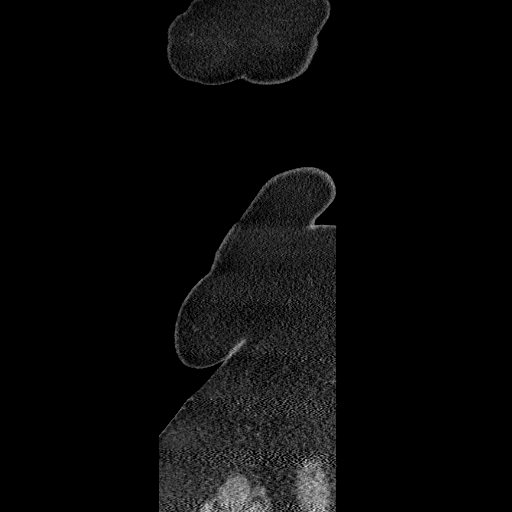
[im 32/169  soft-tissue]
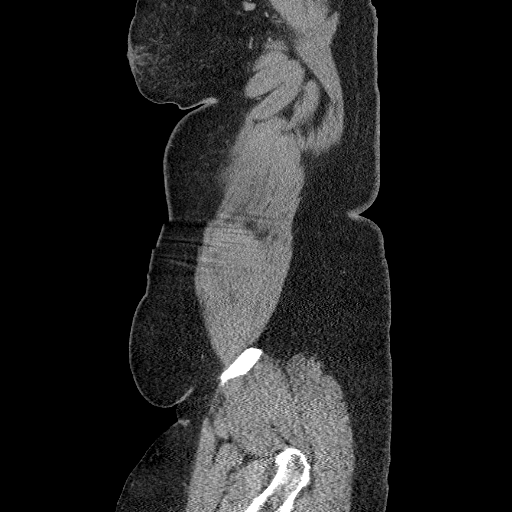
[im 53/169  soft-tissue]
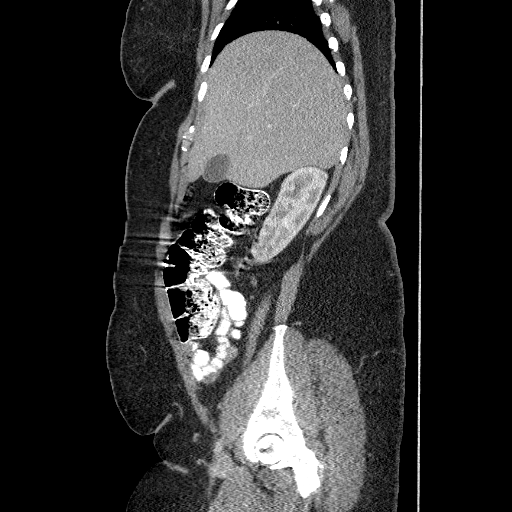

[12 of 36 positions shown; findings below may reference images not displayed]

FINDINGS: Lower chest: The lung bases appear clear. No pleural or pericardial
effusion.

Hepatobiliary: No focal liver abnormality is seen. No gallstones,
gallbladder wall thickening, or biliary dilatation.

Pancreas: Unremarkable. No pancreatic ductal dilatation or
surrounding inflammatory changes.

Spleen: Normal in size without focal abnormality.

Adrenals/Urinary Tract: Adrenal glands are unremarkable. Kidneys are
normal, without renal calculi, focal lesion, or hydronephrosis.
Bladder is unremarkable.

Stomach/Bowel: The stomach and the small bowel loops have a normal
course and caliber. Normal appearance of the colon. No pathologic
dilatation of the large or small bowel loops.

Vascular/Lymphatic: Normal appearance of the abdominal aorta. There
is no upper abdominal adenopathy. No pelvic or inguinal adenopathy.

Reproductive: The uterus appears normal. Normal appearance of the
right ovary. Within the midline anterior upper pelvis again noted is
a cystic lesion which measures 6.2 x 3.0 x 6.8 cm and 63 cc, image
number 80 of series 3. No enhancing soft tissue component or
septation identified within this structure on CT. On the previous
exam this measured 5.6 x 2.5 x 7.4 cm and 52 cc. The small cystic
lesion along the ventral surface of the uterus described on previous
MRI is not seen on today's study.

Other: No evidence for ascites.

Musculoskeletal: No acute or significant osseous findings.
IMPRESSION: 1. Slight increase in volume of cystic lesion within the ventral
aspect of the pelvis. This remains indeterminate. Given the close
proximity to the left ovary this may represent a benign cystic
ovarian neoplasm or perhaps a peritoneal inclusion cyst. Given the
lack of associated enhancing septation and/ or soft tissue component
a malignant cystic ovarian neoplasm is less favored.

## 2018-08-10 ENCOUNTER — Ambulatory Visit: Payer: Medicare Other | Admitting: Family Medicine

## 2018-08-30 ENCOUNTER — Other Ambulatory Visit: Payer: Self-pay | Admitting: Family Medicine

## 2018-08-30 DIAGNOSIS — Z1231 Encounter for screening mammogram for malignant neoplasm of breast: Secondary | ICD-10-CM

## 2018-09-07 ENCOUNTER — Ambulatory Visit: Payer: Medicare Other | Admitting: Family Medicine

## 2018-09-12 ENCOUNTER — Encounter (HOSPITAL_COMMUNITY): Payer: Self-pay

## 2018-09-12 ENCOUNTER — Emergency Department (HOSPITAL_COMMUNITY): Payer: Medicare Other

## 2018-09-12 ENCOUNTER — Other Ambulatory Visit: Payer: Self-pay

## 2018-09-12 ENCOUNTER — Emergency Department (HOSPITAL_COMMUNITY)
Admission: EM | Admit: 2018-09-12 | Discharge: 2018-09-12 | Disposition: A | Discharge status: Home / Self Care | Payer: Medicare Other | Attending: Emergency Medicine | Admitting: Emergency Medicine

## 2018-09-12 DIAGNOSIS — R112 Nausea with vomiting, unspecified: Secondary | ICD-10-CM | POA: Diagnosis not present

## 2018-09-12 DIAGNOSIS — J40 Bronchitis, not specified as acute or chronic: Secondary | ICD-10-CM

## 2018-09-12 DIAGNOSIS — R1010 Upper abdominal pain, unspecified: Secondary | ICD-10-CM | POA: Diagnosis not present

## 2018-09-12 DIAGNOSIS — E039 Hypothyroidism, unspecified: Secondary | ICD-10-CM | POA: Diagnosis not present

## 2018-09-12 DIAGNOSIS — I10 Essential (primary) hypertension: Secondary | ICD-10-CM | POA: Diagnosis not present

## 2018-09-12 DIAGNOSIS — J45901 Unspecified asthma with (acute) exacerbation: Secondary | ICD-10-CM | POA: Insufficient documentation

## 2018-09-12 DIAGNOSIS — Z91010 Allergy to peanuts: Secondary | ICD-10-CM | POA: Insufficient documentation

## 2018-09-12 DIAGNOSIS — Z79899 Other long term (current) drug therapy: Secondary | ICD-10-CM | POA: Insufficient documentation

## 2018-09-12 DIAGNOSIS — R05 Cough: Secondary | ICD-10-CM | POA: Diagnosis present

## 2018-09-12 LAB — CBC WITH DIFFERENTIAL/PLATELET
Abs Immature Granulocytes: 0 10*3/uL (ref 0.0–0.1)
BASOS PCT: 1 %
Basophils Absolute: 0.1 10*3/uL (ref 0.0–0.1)
Eosinophils Absolute: 0.4 10*3/uL (ref 0.0–0.7)
Eosinophils Relative: 5 %
HCT: 36.5 % (ref 36.0–46.0)
Hemoglobin: 11.2 g/dL — ABNORMAL LOW (ref 12.0–15.0)
Immature Granulocytes: 0 %
Lymphocytes Relative: 39 %
Lymphs Abs: 3.7 10*3/uL (ref 0.7–4.0)
MCH: 24.1 pg — AB (ref 26.0–34.0)
MCHC: 30.7 g/dL (ref 30.0–36.0)
MCV: 78.5 fL (ref 78.0–100.0)
MONO ABS: 0.7 10*3/uL (ref 0.1–1.0)
MONOS PCT: 8 %
NEUTROS ABS: 4.5 10*3/uL (ref 1.7–7.7)
NEUTROS PCT: 47 %
PLATELETS: 307 10*3/uL (ref 150–400)
RBC: 4.65 MIL/uL (ref 3.87–5.11)
RDW: 16.4 % — AB (ref 11.5–15.5)
WBC: 9.4 10*3/uL (ref 4.0–10.5)

## 2018-09-12 LAB — HEPATIC FUNCTION PANEL
ALT: 15 U/L (ref 0–44)
AST: 23 U/L (ref 15–41)
Albumin: 3.6 g/dL (ref 3.5–5.0)
Alkaline Phosphatase: 106 U/L (ref 38–126)
TOTAL PROTEIN: 7.9 g/dL (ref 6.5–8.1)
Total Bilirubin: 0.6 mg/dL (ref 0.3–1.2)

## 2018-09-12 LAB — BASIC METABOLIC PANEL
Anion gap: 13 (ref 5–15)
BUN: 11 mg/dL (ref 6–20)
CALCIUM: 9.6 mg/dL (ref 8.9–10.3)
CO2: 22 mmol/L (ref 22–32)
CREATININE: 1.16 mg/dL — AB (ref 0.44–1.00)
Chloride: 104 mmol/L (ref 98–111)
GFR calc Af Amer: >60 mL/min (ref >60)
GFR, EST NON AFRICAN AMERICAN: 54 mL/min — AB (ref >60)
GLUCOSE: 98 mg/dL (ref 70–99)
Potassium: 4.1 mmol/L (ref 3.5–5.1)
SODIUM: 139 mmol/L (ref 135–145)

## 2018-09-12 LAB — LIPASE, BLOOD: LIPASE: 26 U/L (ref 11–51)

## 2018-09-12 MED ORDER — SODIUM CHLORIDE 0.9 % IV BOLUS
1000.0000 mL | Freq: Once | INTRAVENOUS | Status: AC
  Administered 2018-09-12: 1000 mL via INTRAVENOUS

## 2018-09-12 MED ORDER — ONDANSETRON 4 MG PO TBDP
4.0000 mg | ORAL_TABLET | Freq: Once | ORAL | Status: AC | PRN
  Administered 2018-09-12: 4 mg via ORAL
  Filled 2018-09-12: qty 1

## 2018-09-12 MED ORDER — DOXYCYCLINE HYCLATE 100 MG PO CAPS: 100.0000 mg | ORAL_CAPSULE | Freq: Two times a day (BID) | ORAL | 0 refills | Status: DC

## 2018-09-12 MED ORDER — MORPHINE SULFATE (PF) 4 MG/ML IV SOLN
4.0000 mg | Freq: Once | INTRAVENOUS | Status: AC
  Administered 2018-09-12: 4 mg via INTRAVENOUS
  Filled 2018-09-12: qty 1

## 2018-09-12 MED ORDER — ONDANSETRON HCL 4 MG/2ML IJ SOLN
4.0000 mg | Freq: Once | INTRAMUSCULAR | Status: AC
  Administered 2018-09-12: 4 mg via INTRAVENOUS
  Filled 2018-09-12: qty 2

## 2018-09-12 MED ORDER — BENZONATATE 100 MG PO CAPS: 100.0000 mg | ORAL_CAPSULE | Freq: Three times a day (TID) | ORAL | 0 refills | Status: DC

## 2018-09-12 MED ORDER — ONDANSETRON 8 MG PO TBDP: 8.0000 mg | ORAL_TABLET | Freq: Three times a day (TID) | ORAL | 0 refills | Status: DC | PRN

## 2018-09-12 NOTE — ED Triage Notes (Signed)
Pt has been having a non productive cough for the last week with no relief.  A&Ox4 at this time. Pt coughing consistently though triage unable to obtain full history.

## 2018-09-12 NOTE — ED Notes (Signed)
Patient transported to X-ray 

## 2018-09-12 NOTE — ED Notes (Signed)
Patient to bathroom by wheelchair.

## 2018-09-12 NOTE — ED Notes (Signed)
Pt came to nurse 1st stating that she was coughing so much that she got nauseated.  Zofran ordered under standing orders and given to Pt at this time. No change in respiratory rate, lungs still clear.

## 2018-09-12 NOTE — ED Provider Notes (Signed)
[facility] Provider Note   CSN: [id number] Arrival date & time: 09/12/18  1551     History   Chief Complaint Chief Complaint  Patient presents with  . Cough    HPI Suzanne Jacobs is a 52 y.o. female.  HPI Patient presented to the emergency room for evaluation of nausea, vomiting, abdominal pain and cough.  Patient states she started having a cough last evening.  She started having numerous bouts of coughing spells that were not intense and severe.  Patient would have posttussive emesis after the spells.  Patient states she is had some nausea and is also having pain in her upper abdomen.  She denies shortness of breath.  She denies any chest pain.  She denies any dysuria or diarrhea.  Patient denies any history of significant lung problems.  No history of PE or DVT. Past Medical History:  Diagnosis Date  . Anxiety   . Asthma   . Back pain   . Depression   . Dyspnea   . Dysrhythmia   . History of MRSA infection   . Hypertension   . Hypothyroidism   . PONV (postoperative nausea and vomiting)     Patient Active Problem List   Diagnosis Date Noted  . DYSPNEA 12/02/2009  . ANEMIA 08/29/2008  . HYPOTHYROIDISM 08/17/2008  . MORBID OBESITY 08/17/2008  . BIPOLAR DISORDER UNSPECIFIED 08/17/2008  . CELLULITIS AND ABSCESS OF LEG EXCEPT FOOT 08/17/2008  . 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 BILATERAL SALPINGECTOMY Bilateral 11/16/2016   Procedure: BILATERAL SALPINGECTOMY;  Surgeon: Geryl RankinsEvelyn Varnado, MD;  Location: [facility];  Service: Gynecology;  Laterality: Bilateral;  . CESAREAN SECTION    . CYSTECTOMY    . HERNIA REPAIR    . HYSTEROSCOPY W/D&C N/A 08/08/2013   Procedure: DILATATION AND CURETTAGE /HYSTEROSCOPY;  Surgeon: Dorien Chihuahuaara J. Richardson Doppole, MD;  Location: [facility];  Service: Gynecology;  Laterality: N/A;  . LAPAROSCOPY N/A 11/16/2016   Procedure: OPERATIVE LAPAROSCOPY, REMOVAL LEFT PARATUBAL CYST;  Surgeon:  Geryl RankinsEvelyn Varnado, MD;  Location: [facility];  Service: Gynecology;  Laterality: N/A;  . TUBAL LIGATION       OB History    Gravida  1   Para  1   Term      Preterm      AB      Living  1     SAB      TAB      Ectopic      Multiple      Live Births               Home Medications    Prior to Admission medications   Medication Sig Start Date End Date Taking? Authorizing Provider  acetaminophen (TYLENOL) 325 MG tablet Take 325 mg by mouth every 6 (six) hours as needed for moderate pain.     [provider]  albuterol (PROVENTIL HFA;VENTOLIN HFA) 108 (90 BASE) MCG/ACT inhaler Inhale 2 puffs into the lungs every 2 (two) hours as needed for wheezing or shortness of breath (cough). 11/17/15   Cartner, Sharlet SalinaBenjamin, PA-C  benzonatate (TESSALON) 100 MG capsule Take 1 capsule (100 mg total) by mouth every 8 (eight) hours. 09/12/18   Linwood DibblesKnapp, Daisuke Bailey, MD  cephALEXin (KEFLEX) 250 MG capsule Take 1 capsule (250 mg total) by mouth 4 (four) times daily. 10/06/17   Linwood DibblesKnapp, Kai Railsback, MD  doxycycline (VIBRAMYCIN) 100 MG capsule Take 1 capsule (100 mg total) by  mouth 2 (two) times daily. 09/12/18   Linwood Dibbles, MD  FLUoxetine (PROZAC) 40 MG capsule TK ONE C PO QAM 08/12/17   [provider]  HYDROcodone-acetaminophen (NORCO/VICODIN) 5-325 MG tablet Take 1 tablet by mouth every 4 (four) hours as needed. 10/06/17   Linwood Dibbles, MD  ibuprofen (ADVIL,MOTRIN) 600 MG tablet Take 1 tablet (600 mg total) by mouth every 6 (six) hours as needed. Patient not taking: Reported on 10/06/2017 11/16/16   Geryl Rankins, MD  lamoTRIgine (LAMICTAL) 150 MG tablet TK 1 T PO QAM 09/07/17   [provider]  levothyroxine (SYNTHROID, LEVOTHROID) 50 MCG tablet Take 50 mcg by mouth daily before breakfast.     [provider]  lisinopril (PRINIVIL,ZESTRIL) 20 MG tablet Take 20 mg by mouth every morning.    [provider]  ondansetron (ZOFRAN ODT) 8 MG disintegrating tablet Take 1 tablet (8 mg  total) by mouth every 8 (eight) hours as needed for nausea or vomiting. 09/12/18   Linwood Dibbles, MD  oxyCODONE-acetaminophen (PERCOCET/ROXICET) 5-325 MG tablet Take 1-2 tablets by mouth every 4 (four) hours as needed for severe pain. Patient not taking: Reported on 10/06/2017 11/16/16   Geryl Rankins, MD  QUEtiapine (SEROQUEL XR) 300 MG 24 hr tablet Take 300 mg by mouth at bedtime.    [provider]  traZODone (DESYREL) 150 MG tablet Take 150 mg by mouth at bedtime.    [provider]    Family History Family History  Problem Relation Age of Onset  . Hypertension Mother   . Cancer Other     Social History Social History   Tobacco Use  . Smoking status: Never Smoker  . Smokeless tobacco: Never Used  Substance Use Topics  . Alcohol use: No  . Drug use: No     Allergies   Peanut-containing drug products; Sulfa antibiotics; and Sulfamethoxazole-trimethoprim   Review of Systems Review of Systems  All other systems reviewed and are negative.    Physical Exam Updated Vital Signs BP 116/70 (BP Location: Left Arm)   Pulse 79   Temp 98.9 F (37.2 C) (Oral)   Resp 16   LMP 09/05/2018   SpO2 95%   Physical Exam  Constitutional: She appears well-developed and well-nourished. No distress.  HENT:  Head: Normocephalic and atraumatic.  Right Ear: External ear normal.  Left Ear: External ear normal.  Eyes: Conjunctivae are normal. Right eye exhibits no discharge. Left eye exhibits no discharge. No scleral icterus.  Neck: Neck supple. No tracheal deviation present.  Cardiovascular: Normal rate, regular rhythm and intact distal pulses.  Pulmonary/Chest: Effort normal and breath sounds normal. No stridor. No respiratory distress. She has no wheezes. She has no rales.  Abdominal: Soft. Bowel sounds are normal. She exhibits no distension. There is tenderness in the epigastric area. There is no rebound and no guarding.  Musculoskeletal: She exhibits no edema or  tenderness.  Neurological: She is alert. She has normal strength. No cranial nerve deficit (no facial droop, extraocular movements intact, no slurred speech) or sensory deficit. She exhibits normal muscle tone. She displays no seizure activity. Coordination normal.  Skin: Skin is warm and dry. No rash noted.  Psychiatric: She has a normal mood and affect.  Nursing note and vitals reviewed.    ED Treatments / Results  Labs (all labs ordered are listed, but only abnormal results are displayed) Labs Reviewed  CBC WITH DIFFERENTIAL/PLATELET - Abnormal; Notable for the following components:      Result Value  Hemoglobin 11.2 (*)    MCH 24.1 (*)    RDW 16.4 (*)    All other components within normal limits  BASIC METABOLIC PANEL - Abnormal; Notable for the following components:   Creatinine, Ser 1.16 (*)    GFR calc non Af Amer 54 (*)    All other components within normal limits  HEPATIC FUNCTION PANEL  LIPASE, BLOOD    EKG None  Radiology Dg Chest 2 View  Result Date: 09/12/2018 CLINICAL DATA:  Shortness of breath, chest pain and cough. EXAM: CHEST - 2 VIEW COMPARISON:  Chest radiograph November 24, 2014 FINDINGS: Cardiomediastinal silhouette is unremarkable for this low inspiratory examination with crowded vasculature markings. The lungs are clear without pleural effusions or focal consolidations. Mild bronchitic changes. Strandy densities LEFT lower lung zone. Trachea projects midline and there is no pneumothorax. Included soft tissue planes and osseous structures are non-suspicious. Degenerative change of the spine. IMPRESSION: 1. Mild bronchitic changes.  LEFT lung base atelectasis. Electronically Signed   By: Awilda Metro M.D.   On: 09/12/2018 20:02    Procedures Procedures (including critical care time)  Medications Ordered in ED Medications  ondansetron (ZOFRAN-ODT) disintegrating tablet 4 mg (4 mg Oral Given 09/12/18 1649)  morphine 4 MG/ML injection 4 mg (4 mg  Intravenous Given 09/12/18 2000)  sodium chloride 0.9 % bolus 1,000 mL (0 mLs Intravenous Stopped 09/12/18 2247)  ondansetron (ZOFRAN) injection 4 mg (4 mg Intravenous Given 09/12/18 2000)     Initial Impression / Assessment and Plan / ED Course  I have reviewed the triage vital signs and the nursing notes.  Pertinent labs & imaging results that were available during my care of the patient were reviewed by me and considered in my medical decision making (see chart for details).   Patient presented to the emergency room with complaints of cough nausea vomiting and some abdominal pain.  Patient's x-ray does not show pneumonia but more consistent with a bronchitis.  Her laboratory tests are otherwise reassuring.  I doubt pancreatitis or cholecystitis.  I think her upper abdominal discomfort was associated with her coughing.  The vomiting was posttussive in nature.  Patient improved with treatment in the ED.  No episodes of vomiting here.  At this time there does not appear to be any evidence of an acute emergency medical condition and the patient appears stable for discharge with appropriate outpatient follow up.   Final Clinical Impressions(s) / ED Diagnoses   Final diagnoses:  Bronchitis    ED Discharge Orders         Ordered    benzonatate (TESSALON) 100 MG capsule  Every 8 hours     09/12/18 2247    doxycycline (VIBRAMYCIN) 100 MG capsule  2 times daily     09/12/18 2247    ondansetron (ZOFRAN ODT) 8 MG disintegrating tablet  Every 8 hours PRN     09/12/18 2247           Linwood Dibbles, MD 09/12/18 2249

## 2018-09-12 NOTE — Discharge Instructions (Addendum)
Take the medications as prescribed, follow-up with a primary care doctor if you are not improving in the next week

## 2018-09-29 ENCOUNTER — Ambulatory Visit: Payer: Medicare Other

## 2018-10-04 ENCOUNTER — Ambulatory Visit: Payer: Medicare Other | Admitting: Family Medicine

## 2018-11-01 ENCOUNTER — Ambulatory Visit: Payer: Self-pay

## 2018-11-01 NOTE — Telephone Encounter (Signed)
Pt mother called to say her daughter Tyler AasDoris was up all night with a HR of 110. Mother states that her daughter is in bed now resting. Mom answered most of the triage questions but I was able to speak with Tyler Aasoris, the patient, briefly.  She states that she is just extremely tired. She denies chest pain. She denies SOB. She was unable to tell if her heart was racing. She was unable to tap out the rhythm. A gentleman was in on the conversation who states that today the pt's HR is 98.  Mom states that it is because she had her daughter resting. Family dynamics sound stressed. There is no transportation for the patient. I recommended ED for evaluation because of the weakness which is what the patient was able to report. Family is still deciding on this action. Care advice read to mother TununakRosa.  Rosa verbalized understanding of instructions.   Reason for Disposition . Dizziness, lightheadedness, or weakness  Answer Assessment - Initial Assessment Questions 1. DESCRIPTION: "Please describe your heart rate or heart beat that you are having" (e.g., fast/slow, regular/irregular, skipped or extra beats, "palpitations")     fast 2. ONSET: "When did it start?" (Minutes, hours or days)      Early evening  3. DURATION: "How long does it last" (e.g., seconds, minutes, hours)     All night 4. PATTERN "Does it come and go, or has it been constant since it started?"  "Does it get worse with exertion?"   "Are you feeling it now?"     No not sure talking to mom 5. TAP: "Using your hand, can you tap out what you are feeling on a chair or table in front of you, so that I can hear?" (Note: not all patients can do this)       No pt unable 6. HEART RATE: "Can you tell me your heart rate?" "How many beats in 15 seconds?"  (Note: not all patients can do this)       no 7. RECURRENT SYMPTOM: "Have you ever had this before?" If so, ask: "When was the last time?" and "What happened that time?"      yes 8. CAUSE: "What do you think  is causing the palpitations?"     SOB 9. CARDIAC HISTORY: "Do you have any history of heart disease?" (e.g., heart attack, angina, bypass surgery, angioplasty, arrhythmia)     no 10. OTHER SYMPTOMS: "Do you have any other symptoms?" (e.g., dizziness, chest pain, sweating, difficulty breathing)       tired 11. PREGNANCY: "Is there any chance you are pregnant?" "When was your last menstrual period?"   N/A  Protocols used: HEART RATE AND HEARTBEAT QUESTIONS-A-AH

## 2018-11-01 NOTE — Telephone Encounter (Signed)
Pt's brother called on her behalf, to report elevated pulse rate that occurred yesterday afternoon.  Requested to speak with the pt.  Kevin Fenton, pt's brother, stated the pt. has Autism, and has a difficult time understanding assess. questions.  The pt. is present and gave approval for brother to speak on her behalf.  Brother stated his mother is worried about the pt., and requested that pt. be scheduled for an appt. tomorrow for elevated pulse rate; reported pulse of 104-110 that was noted on Pulse Oximeter, yesterday afternoon.  Brother stated the pt. did not have complaints of dizziness, chest pain, or  shortness of breath at time of increased pulse.  Questioned pulse rate at this time; reported Pulse 88-91 at this time.  Reported 02 Sat. Is 98% on room air.  Denied that pt. has any complaints at this time.  Brother reported that the pt. Has an appt. With Dr. Leretha Pol on 12/5, but that his mother is very nervous, and is requesting the pt. Be seen tomorrow.  Advised of normal pulse rate 60-100. Further advised that if her heart rate is 88-91 at this time, and 02 Sat is at 98%,  and the pt. does not have any complaints or symptoms, there may not be a need to make her an acute appt. At this time.  Brother requested nurse to repeat that explanation to his mother, since she is so nervous about the pt.  Brother ques. What is the limit on her heart rate that he should call the office.  Advised that certain medications (pt's Albuterol Inhaler), caffeine, stress are examples that can cause increase in heart rate.  Offered to try to move her New Patient appt. to an earlier date, so the pt. could establish care sooner.  Advised that there are no New Pt. Appts for Dr. Leretha Pol available prior to 11/24/18, in which pt. is currently scheduled.  Advised will place pt. on a Wait list, and to call back if any further concerns of increased heart rate or concerning symptoms.  Brother verb. Understanding.  Agrees with plan.           .Reason for Disposition . Palpitations  Answer Assessment - Initial Assessment Questions 1. DESCRIPTION: "Please describe your heart rate or heart beat that you are having" (e.g., fast/slow, regular/irregular, skipped or extra beats, "palpitations")     Heart rate 110 yesterday afternoon  2. ONSET: "When did it start?" (Minutes, hours or days)      unsure 3. DURATION: "How long does it last" (e.g., seconds, minutes, hours)    Unsure 4. PATTERN "Does it come and go, or has it been constant since it started?"  "Does it get worse with exertion?"   "Are you feeling it now?"     The pulse was noted on the pulse ox monitor 5. TAP: "Using your hand, can you tap out what you are feeling on a chair or table in front of you, so that I can hear?" (Note: not all patients can do this)      N/a  6. HEART RATE: "Can you tell me your heart rate?" "How many beats in 15 seconds?"  (Note: not all patients can do this)       Pt. unable to check; brother is speaking for the pt. ; pulse is 88 right now on pulse oximeter; O2 is 98% on room air   7. RECURRENT SYMPTOM: "Have you ever had this before?" If so, ask: "When was the last time?" and "What happened  that time?"      No  8. CAUSE: "What do you think is causing the palpitations?"    Denied any different feeling in chest 9. CARDIAC HISTORY: "Do you have any history of heart disease?" (e.g., heart attack, angina, bypass surgery, angioplasty, arrhythmia)     No  10. OTHER SYMPTOMS: "Do you have any other symptoms?" (e.g., dizziness, chest pain, sweating, difficulty breathing)       Denied any dizziness, chest pain, shortness of breath 11. PREGNANCY: "Is there any chance you are pregnant?" "When was your last menstrual period?"       No ; does not have periods; starting into Menopause  Protocols used: HEART RATE AND HEARTBEAT QUESTIONS-A-AH

## 2018-11-03 NOTE — Telephone Encounter (Signed)
FYI

## 2018-11-09 ENCOUNTER — Ambulatory Visit: Payer: Medicare Other

## 2018-11-24 ENCOUNTER — Ambulatory Visit: Payer: Medicare Other | Admitting: Family Medicine

## 2018-11-24 ENCOUNTER — Encounter

## 2019-01-26 ENCOUNTER — Ambulatory Visit: Payer: Medicare Other | Admitting: Family Medicine

## 2019-03-29 ENCOUNTER — Ambulatory Visit: Payer: Medicare Other

## 2019-05-17 ENCOUNTER — Ambulatory Visit: Payer: Medicare Other

## 2019-11-08 ENCOUNTER — Other Ambulatory Visit: Payer: Self-pay | Admitting: Family Medicine

## 2019-11-08 DIAGNOSIS — Z1231 Encounter for screening mammogram for malignant neoplasm of breast: Secondary | ICD-10-CM

## 2020-03-14 ENCOUNTER — Telehealth: Payer: Self-pay

## 2020-03-14 NOTE — Telephone Encounter (Signed)
Notes on file from [facility] [phone] sent referral to scheduling

## 2020-11-12 ENCOUNTER — Other Ambulatory Visit: Payer: Self-pay | Admitting: Student

## 2020-11-12 DIAGNOSIS — Z1231 Encounter for screening mammogram for malignant neoplasm of breast: Secondary | ICD-10-CM

## 2021-06-25 ENCOUNTER — Other Ambulatory Visit: Payer: Self-pay | Admitting: Student

## 2021-06-25 DIAGNOSIS — Z780 Asymptomatic menopausal state: Secondary | ICD-10-CM

## 2023-10-24 ENCOUNTER — Emergency Department (HOSPITAL_BASED_OUTPATIENT_CLINIC_OR_DEPARTMENT_OTHER)
Admission: EM | Admit: 2023-10-24 | Discharge: 2023-10-24 | Disposition: A | Discharge status: Home / Self Care | Payer: 59 | Attending: Emergency Medicine | Admitting: Emergency Medicine

## 2023-10-24 ENCOUNTER — Other Ambulatory Visit: Payer: Self-pay

## 2023-10-24 ENCOUNTER — Emergency Department (HOSPITAL_BASED_OUTPATIENT_CLINIC_OR_DEPARTMENT_OTHER): Payer: 59

## 2023-10-24 ENCOUNTER — Encounter (HOSPITAL_BASED_OUTPATIENT_CLINIC_OR_DEPARTMENT_OTHER): Payer: Self-pay | Admitting: *Deleted

## 2023-10-24 DIAGNOSIS — E039 Hypothyroidism, unspecified: Secondary | ICD-10-CM | POA: Insufficient documentation

## 2023-10-24 DIAGNOSIS — Z79899 Other long term (current) drug therapy: Secondary | ICD-10-CM | POA: Diagnosis not present

## 2023-10-24 DIAGNOSIS — Z91010 Allergy to peanuts: Secondary | ICD-10-CM | POA: Diagnosis not present

## 2023-10-24 DIAGNOSIS — I10 Essential (primary) hypertension: Secondary | ICD-10-CM | POA: Diagnosis not present

## 2023-10-24 DIAGNOSIS — M436 Torticollis: Secondary | ICD-10-CM | POA: Insufficient documentation

## 2023-10-24 DIAGNOSIS — J45909 Unspecified asthma, uncomplicated: Secondary | ICD-10-CM | POA: Diagnosis not present

## 2023-10-24 DIAGNOSIS — M542 Cervicalgia: Secondary | ICD-10-CM | POA: Diagnosis present

## 2023-10-24 LAB — BASIC METABOLIC PANEL
Anion gap: 8 (ref 5–15)
BUN: 19 mg/dL (ref 6–20)
CO2: 27 mmol/L (ref 22–32)
Calcium: 10.3 mg/dL (ref 8.9–10.3)
Chloride: 104 mmol/L (ref 98–111)
Creatinine, Ser: 0.98 mg/dL (ref 0.44–1.00)
GFR, Estimated: >60 mL/min (ref >60)
Glucose, Bld: 100 mg/dL — ABNORMAL HIGH (ref 70–99)
Potassium: 4.3 mmol/L (ref 3.5–5.1)
Sodium: 139 mmol/L (ref 135–145)

## 2023-10-24 LAB — CBC WITH DIFFERENTIAL/PLATELET
Abs Immature Granulocytes: 0.03 10*3/uL (ref 0.00–0.07)
Basophils Absolute: 0 10*3/uL (ref 0.0–0.1)
Basophils Relative: 0 %
Eosinophils Absolute: 0.2 10*3/uL (ref 0.0–0.5)
Eosinophils Relative: 2 %
HCT: 35.2 % — ABNORMAL LOW (ref 36.0–46.0)
Hemoglobin: 11.2 g/dL — ABNORMAL LOW (ref 12.0–15.0)
Immature Granulocytes: 0 %
Lymphocytes Relative: 19 %
Lymphs Abs: 1.9 10*3/uL (ref 0.7–4.0)
MCH: 25.3 pg — ABNORMAL LOW (ref 26.0–34.0)
MCHC: 31.8 g/dL (ref 30.0–36.0)
MCV: 79.6 fL — ABNORMAL LOW (ref 80.0–100.0)
Monocytes Absolute: 0.5 10*3/uL (ref 0.1–1.0)
Monocytes Relative: 5 %
Neutro Abs: 7.1 10*3/uL (ref 1.7–7.7)
Neutrophils Relative %: 74 %
Platelets: 319 10*3/uL (ref 150–400)
RBC: 4.42 MIL/uL (ref 3.87–5.11)
RDW: 15.1 % (ref 11.5–15.5)
WBC: 9.8 10*3/uL (ref 4.0–10.5)
nRBC: 0 % (ref 0.0–0.2)

## 2023-10-24 MED ORDER — CYCLOBENZAPRINE HCL 5 MG PO TABS
5.0000 mg | ORAL_TABLET | Freq: Once | ORAL | Status: AC
  Administered 2023-10-24: 5 mg via ORAL
  Filled 2023-10-24: qty 1

## 2023-10-24 MED ORDER — CYCLOBENZAPRINE HCL 10 MG PO TABS: 10.0000 mg | ORAL_TABLET | Freq: Two times a day (BID) | ORAL | 0 refills | Status: DC | PRN

## 2023-10-24 MED ORDER — KETOROLAC TROMETHAMINE 60 MG/2ML IM SOLN
30.0000 mg | Freq: Once | INTRAMUSCULAR | Status: AC
  Administered 2023-10-24: 30 mg via INTRAMUSCULAR
  Filled 2023-10-24: qty 2

## 2023-10-24 MED ORDER — DIAZEPAM 5 MG PO TABS
5.0000 mg | ORAL_TABLET | Freq: Once | ORAL | Status: AC
  Administered 2023-10-24: 5 mg via ORAL
  Filled 2023-10-24: qty 1

## 2023-10-24 MED ORDER — NAPROXEN 500 MG PO TABS: 500.0000 mg | ORAL_TABLET | Freq: Two times a day (BID) | ORAL | 0 refills | Status: DC

## 2023-10-24 NOTE — Discharge Instructions (Addendum)
Please take the naproxen twice daily for the next 4 to 5 days.  Take the Flexeril as needed for muscle spasms.  Follow-up with your doctor.  Return to the ER for worsening symptoms.

## 2023-10-24 NOTE — ED Triage Notes (Signed)
Pt is here with pain and stiffness in neck which she woke up with yesterday am.  This has been getting worse and pain is radiating into shoulders and arm.

## 2023-10-24 NOTE — ED Provider Notes (Signed)
  Physical Exam  BP 134/80   Pulse 79   Temp 97.8 F (36.6 C)   Resp 16   SpO2 100%   Physical Exam  Procedures  Procedures  ED Course / MDM    Medical Decision Making Amount and/or Complexity of Data Reviewed Labs: ordered. Radiology: ordered.  Risk Prescription drug management.   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  Her workup thus far has been unremarkable.  On reassessment the patient is feeling much better.  The patient and her family requesting discharge.  She is discharged with return precautions.       Durwin Glaze, MD 10/24/23 [phone]

## 2023-10-24 NOTE — ED Provider Notes (Signed)
[place] [place] EMERGENCY DEPARTMENT AT [facility] Provider Note   CSN: [id number] Arrival date & time: 10/24/23  1059     History  Chief Complaint  Patient presents with   Torticollis    Suzanne Jacobs is a 57 y.o. female.  HPI   57 year old female with medical history significant for anxiety, asthma, depression, HTN, hypothyroidism, morbid obesity, bipolar disorder, intellectual disability who presents to the emergency department with chief complaint of torticollis.  The patient denies any recent falls or trauma and family deny any known trauma that they can think of.  She had been sitting watching TV frequently in an awkward position for the last day and her neck is now stuck in an awkward position slightly rotated.  She endorses pain in her posterior neck, stiffness in the neck.  This has been getting worse and now she is having pain radiating into the shoulders and arms bilaterally.  No weakness.  Home Medications Prior to Admission medications   Medication Sig Start Date End Date Taking? Authorizing Provider  acetaminophen (TYLENOL) 325 MG tablet Take 325 mg by mouth every 6 (six) hours as needed for moderate pain.     [provider]  albuterol (PROVENTIL HFA;VENTOLIN HFA) 108 (90 BASE) MCG/ACT inhaler Inhale 2 puffs into the lungs every 2 (two) hours as needed for wheezing or shortness of breath (cough). 11/17/15   Cartner, Sharlet Salina, PA-C  benzonatate (TESSALON) 100 MG capsule Take 1 capsule (100 mg total) by mouth every 8 (eight) hours. 09/12/18   Linwood Dibbles, MD  cephALEXin (KEFLEX) 250 MG capsule Take 1 capsule (250 mg total) by mouth 4 (four) times daily. 10/06/17   Linwood Dibbles, MD  doxycycline (VIBRAMYCIN) 100 MG capsule Take 1 capsule (100 mg total) by mouth 2 (two) times daily. 09/12/18   Linwood Dibbles, MD  FLUoxetine (PROZAC) 40 MG capsule TK ONE C PO QAM 08/12/17   [provider]  HYDROcodone-acetaminophen (NORCO/VICODIN) 5-325 MG tablet Take 1 tablet  by mouth every 4 (four) hours as needed. 10/06/17   Linwood Dibbles, MD  ibuprofen (ADVIL,MOTRIN) 600 MG tablet Take 1 tablet (600 mg total) by mouth every 6 (six) hours as needed. Patient not taking: Reported on 10/06/2017 11/16/16   Geryl Rankins, MD  lamoTRIgine (LAMICTAL) 150 MG tablet TK 1 T PO QAM 09/07/17   [provider]  levothyroxine (SYNTHROID, LEVOTHROID) 50 MCG tablet Take 50 mcg by mouth daily before breakfast.     [provider]  lisinopril (PRINIVIL,ZESTRIL) 20 MG tablet Take 20 mg by mouth every morning.    [provider]  ondansetron (ZOFRAN ODT) 8 MG disintegrating tablet Take 1 tablet (8 mg total) by mouth every 8 (eight) hours as needed for nausea or vomiting. 09/12/18   Linwood Dibbles, MD  oxyCODONE-acetaminophen (PERCOCET/ROXICET) 5-325 MG tablet Take 1-2 tablets by mouth every 4 (four) hours as needed for severe pain. Patient not taking: Reported on 10/06/2017 11/16/16   Geryl Rankins, MD  QUEtiapine (SEROQUEL XR) 300 MG 24 hr tablet Take 300 mg by mouth at bedtime.    [provider]  traZODone (DESYREL) 150 MG tablet Take 150 mg by mouth at bedtime.    [provider]      Allergies    Peanut-containing drug products, Sulfa antibiotics, and Sulfamethoxazole-trimethoprim    Review of Systems   Review of Systems  All other systems reviewed and are negative.   Physical Exam Updated Vital Signs BP 109/78   Pulse 80   Temp  97.8 F (36.6 C)   Resp 18   SpO2 99%  Physical Exam Vitals and nursing note reviewed.  Constitutional:      General: She is not in acute distress. HENT:     Head: Normocephalic and atraumatic.  Eyes:     Conjunctiva/sclera: Conjunctivae normal.     Pupils: Pupils are equal, round, and reactive to light.  Neck:     Comments: Mild midline tenderness to palpation of the cervical spine, decreased range of motion due to pain, torticollis present Cardiovascular:     Rate and Rhythm: Normal rate and  regular rhythm.  Pulmonary:     Effort: Pulmonary effort is normal. No respiratory distress.  Abdominal:     General: There is no distension.     Tenderness: There is no guarding.  Musculoskeletal:        General: No deformity or signs of injury.     Cervical back: Neck supple. Torticollis present. Decreased range of motion.  Skin:    Findings: No lesion or rash.  Neurological:     General: No focal deficit present.     Mental Status: She is alert. Mental status is at baseline.     Cranial Nerves: No cranial nerve deficit.     Sensory: No sensory deficit.     Motor: No weakness.     ED Results / Procedures / Treatments   Labs (all labs ordered are listed, but only abnormal results are displayed) Labs Reviewed  CBC WITH DIFFERENTIAL/PLATELET - Abnormal; Notable for the following components:      Result Value   Hemoglobin 11.2 (*)    HCT 35.2 (*)    MCV 79.6 (*)    MCH 25.3 (*)    All other components within normal limits  BASIC METABOLIC PANEL - Abnormal; Notable for the following components:   Glucose, Bld 100 (*)    All other components within normal limits    EKG None  Radiology CT Cervical Spine Wo Contrast  Result Date: 10/24/2023 CLINICAL DATA:  Pain and stiffness EXAM: CT CERVICAL SPINE WITHOUT CONTRAST TECHNIQUE: Multidetector CT imaging of the cervical spine was performed without intravenous contrast. Multiplanar CT image reconstructions were also generated. RADIATION DOSE REDUCTION: This exam was performed according to the departmental dose-optimization program which includes automated exposure control, adjustment of the mA and/or kV according to patient size and/or use of iterative reconstruction technique. COMPARISON:  None Available. FINDINGS: Alignment: Reversal of cervical lordosis. Facet alignment is within normal limits. Skull base and vertebrae: No acute fracture. No primary bone lesion or focal pathologic process. Soft tissues and spinal canal: No  prevertebral fluid or swelling. No visible canal hematoma. Disc levels: Multilevel degenerative changes. Moderate disc space narrowing C4 through C7. Facet degenerative changes at multiple levels with mild multilevel foraminal narrowing. Upper chest: Negative. Other: None IMPRESSION: Reversal of cervical lordosis with multilevel degenerative changes. No acute osseous abnormality. Electronically Signed   By: Jasmine Pang M.D.   On: 10/24/2023 14:34    Procedures Procedures    Medications Ordered in ED Medications  ketorolac (TORADOL) injection 30 mg (has no administration in time range)  cyclobenzaprine (FLEXERIL) tablet 5 mg (has no administration in time range)  diazepam (VALIUM) tablet 5 mg (5 mg Oral Given 10/24/23 1311)    ED Course/ Medical Decision Making/ A&P  Medical Decision Making Amount and/or Complexity of Data Reviewed Labs: ordered. Radiology: ordered.  Risk Prescription drug management.    57 year old female with medical history significant for anxiety, asthma, depression, HTN, hypothyroidism, morbid obesity, bipolar disorder, intellectual disability who presents to the emergency department with chief complaint of torticollis.  The patient denies any recent falls or trauma and family deny any known trauma that they can think of.  She had been sitting watching TV frequently in an awkward position for the last day and her neck is now stuck in an awkward position slightly rotated.  She endorses pain in her posterior neck, stiffness in the neck.  This has been getting worse and now she is having pain radiating into the shoulders and arms bilaterally.  No weakness.  On arrival, the patient was vitally stable, afebrile, initially mildly tachycardic, improved after initial Valium administration, hemodynamically stable.  Physical exam revealed a normal neurologic exam.  Patient presenting with evidence of torticollis on exam.  No clear evidence of  trauma.  Will obtain CT imaging, screening labs and reassess.  CT Cervical Spine: IMPRESSION:  Reversal of cervical lordosis with multilevel degenerative changes.  No acute osseous abnormality.    Labs: CBC without a leukocytosis, mild anemia to 11.2, BMP unremarkable.  Patient feeling somewhat improved following Valium however still with persistent symptoms, will administer Flexeril and Toradol and reassess with likely plan for discharge, signout given to Dr. Rhae Hammock at 1530 pending reassessment after medications.    Final Clinical Impression(s) / ED Diagnoses Final diagnoses:  Torticollis    Rx / DC Orders ED Discharge Orders     None         Ernie Avena, MD 10/24/23 [phone]

## 2024-08-28 ENCOUNTER — Emergency Department (HOSPITAL_BASED_OUTPATIENT_CLINIC_OR_DEPARTMENT_OTHER)
Admission: EM | Admit: 2024-08-28 | Discharge: 2024-08-28 | Disposition: A | Discharge status: Home / Self Care | Attending: Emergency Medicine | Admitting: Emergency Medicine

## 2024-08-28 ENCOUNTER — Emergency Department (HOSPITAL_BASED_OUTPATIENT_CLINIC_OR_DEPARTMENT_OTHER): Admitting: Radiology

## 2024-08-28 ENCOUNTER — Other Ambulatory Visit: Payer: Self-pay

## 2024-08-28 ENCOUNTER — Other Ambulatory Visit (HOSPITAL_BASED_OUTPATIENT_CLINIC_OR_DEPARTMENT_OTHER): Payer: Self-pay

## 2024-08-28 DIAGNOSIS — Z91010 Allergy to peanuts: Secondary | ICD-10-CM | POA: Insufficient documentation

## 2024-08-28 DIAGNOSIS — I10 Essential (primary) hypertension: Secondary | ICD-10-CM | POA: Insufficient documentation

## 2024-08-28 DIAGNOSIS — J45909 Unspecified asthma, uncomplicated: Secondary | ICD-10-CM | POA: Insufficient documentation

## 2024-08-28 DIAGNOSIS — R300 Dysuria: Secondary | ICD-10-CM | POA: Diagnosis present

## 2024-08-28 DIAGNOSIS — M25561 Pain in right knee: Secondary | ICD-10-CM | POA: Diagnosis not present

## 2024-08-28 DIAGNOSIS — N3000 Acute cystitis without hematuria: Secondary | ICD-10-CM | POA: Insufficient documentation

## 2024-08-28 LAB — URINALYSIS, ROUTINE W REFLEX MICROSCOPIC
Bilirubin Urine: NEGATIVE
Glucose, UA: NEGATIVE mg/dL
Ketones, ur: NEGATIVE mg/dL
Nitrite: POSITIVE — AB
RBC / HPF: >50 RBC/hpf (ref 0–5)
Specific Gravity, Urine: 1.023 (ref 1.005–1.030)
WBC, UA: >50 WBC/hpf (ref 0–5)
pH: 5.5 (ref 5.0–8.0)

## 2024-08-28 LAB — PREGNANCY, URINE: Preg Test, Ur: NEGATIVE

## 2024-08-28 MED ORDER — MELOXICAM 7.5 MG PO TABS
7.5000 mg | ORAL_TABLET | Freq: Every day | ORAL | 0 refills | Status: AC
  Filled 2024-08-28: qty 10, 10d supply, fill #0

## 2024-08-28 MED ORDER — KETOROLAC TROMETHAMINE 15 MG/ML IJ SOLN
15.0000 mg | Freq: Once | INTRAMUSCULAR | Status: AC
  Administered 2024-08-28: 15 mg via INTRAMUSCULAR
  Filled 2024-08-28: qty 1

## 2024-08-28 MED ORDER — NITROFURANTOIN MONOHYD MACRO 100 MG PO CAPS
100.0000 mg | ORAL_CAPSULE | Freq: Once | ORAL | Status: AC
  Administered 2024-08-28: 100 mg via ORAL
  Filled 2024-08-28: qty 1

## 2024-08-28 MED ORDER — NITROFURANTOIN MONOHYD MACRO 100 MG PO CAPS
100.0000 mg | ORAL_CAPSULE | Freq: Two times a day (BID) | ORAL | 0 refills | Status: AC
  Filled 2024-08-28: qty 10, 5d supply, fill #0

## 2024-08-28 NOTE — ED Provider Notes (Signed)
 [place] EMERGENCY DEPARTMENT AT [facility] Provider Note   CSN: [id number] Arrival date & time: 08/28/24  1223     Patient presents with: Knee Pain and Urinary Frequency   Suzanne Jacobs is a 58 y.o. female.   Patient with past medical history of hypertension, asthma --presents to the emergency department today for evaluation of right knee pain and dysuria.  Right knee pain has been an ongoing problem for the patient, per family at bedside.  Patient reports that the pain is worse when she moves the knee and bears weight.  No fevers or vomiting.  She denies any falls or injuries.  She has taken ibuprofen  and applied heat pad without any improvement.  She does not have an orthopedist.  Patient also reports dysuria over the past week or so.  No flank pain or fevers, anterior abdominal pain.       Prior to Admission medications   Medication Sig Start Date End Date Taking? Authorizing Provider  acetaminophen  (TYLENOL ) 325 MG tablet Take 325 mg by mouth every 6 (six) hours as needed for moderate pain.     [provider]  albuterol  (PROVENTIL  HFA;VENTOLIN  HFA) 108 (90 BASE) MCG/ACT inhaler Inhale 2 puffs into the lungs every 2 (two) hours as needed for wheezing or shortness of breath (cough). 11/17/15   Cartner, Morene, PA-C  benzonatate  (TESSALON ) 100 MG capsule Take 1 capsule (100 mg total) by mouth every 8 (eight) hours. 09/12/18   Randol Simmonds, MD  cephALEXin  (KEFLEX ) 250 MG capsule Take 1 capsule (250 mg total) by mouth 4 (four) times daily. 10/06/17   Randol Simmonds, MD  cyclobenzaprine  (FLEXERIL ) 10 MG tablet Take 1 tablet (10 mg total) by mouth 2 (two) times daily as needed for muscle spasms. 10/24/23   Ula Prentice JONELLE, MD  doxycycline  (VIBRAMYCIN ) 100 MG capsule Take 1 capsule (100 mg total) by mouth 2 (two) times daily. 09/12/18   Randol Simmonds, MD  FLUoxetine (PROZAC) 40 MG capsule TK ONE C PO QAM 08/12/17   [provider]  HYDROcodone -acetaminophen   (NORCO/VICODIN) 5-325 MG tablet Take 1 tablet by mouth every 4 (four) hours as needed. 10/06/17   Randol Simmonds, MD  ibuprofen  (ADVIL ,MOTRIN ) 600 MG tablet Take 1 tablet (600 mg total) by mouth every 6 (six) hours as needed. Patient not taking: Reported on 10/06/2017 11/16/16   Varnado, Evelyn, MD  lamoTRIgine (LAMICTAL) 150 MG tablet TK 1 T PO QAM 09/07/17   [provider]  levothyroxine (SYNTHROID, LEVOTHROID) 50 MCG tablet Take 50 mcg by mouth daily before breakfast.     [provider]  lisinopril (PRINIVIL,ZESTRIL) 20 MG tablet Take 20 mg by mouth every morning.    [provider]  naproxen  (NAPROSYN ) 500 MG tablet Take 1 tablet (500 mg total) by mouth 2 (two) times daily. 10/24/23   Ula Prentice JONELLE, MD  ondansetron  (ZOFRAN  ODT) 8 MG disintegrating tablet Take 1 tablet (8 mg total) by mouth every 8 (eight) hours as needed for nausea or vomiting. 09/12/18   Randol Simmonds, MD  oxyCODONE -acetaminophen  (PERCOCET/ROXICET) 5-325 MG tablet Take 1-2 tablets by mouth every 4 (four) hours as needed for severe pain. Patient not taking: Reported on 10/06/2017 11/16/16   Varnado, Evelyn, MD  QUEtiapine (SEROQUEL XR) 300 MG 24 hr tablet Take 300 mg by mouth at bedtime.    [provider]  traZODone (DESYREL) 150 MG tablet Take 150 mg by mouth at bedtime.    [provider]    Allergies: Peanut-containing  drug products, Sulfa antibiotics, and Sulfamethoxazole-trimethoprim    Review of Systems  Updated Vital Signs BP 137/81 (BP Location: Right Arm)   Pulse 85   Temp 97.9 F (36.6 C) (Oral)   Resp 19   SpO2 99%   Physical Exam Vitals and nursing note reviewed.  Constitutional:      General: She is not in acute distress.    Appearance: She is well-developed.  HENT:     Head: Normocephalic and atraumatic.     Right Ear: External ear normal.     Left Ear: External ear normal.     Nose: Nose normal.  Eyes:     Conjunctiva/sclera: Conjunctivae normal.   Cardiovascular:     Rate and Rhythm: Normal rate and regular rhythm.     Heart sounds: No murmur heard. Pulmonary:     Effort: No respiratory distress.     Breath sounds: No wheezing, rhonchi or rales.  Abdominal:     Palpations: Abdomen is soft.     Tenderness: There is no abdominal tenderness. There is no guarding or rebound.  Musculoskeletal:     Cervical back: Normal range of motion and neck supple.     Right upper leg: No tenderness.     Right knee: No swelling or effusion. Normal range of motion. Tenderness present over the medial joint line and lateral joint line.     Right lower leg: No tenderness. No edema.     Left lower leg: No edema.     Right ankle: No tenderness. Normal range of motion.  Skin:    General: Skin is warm and dry.     Findings: No rash.  Neurological:     General: No focal deficit present.     Mental Status: She is alert. Mental status is at baseline.     Motor: No weakness.  Psychiatric:        Mood and Affect: Mood normal.     (all labs ordered are listed, but only abnormal results are displayed) Labs Reviewed  URINALYSIS, ROUTINE W REFLEX MICROSCOPIC - Abnormal; Notable for the following components:      Result Value   APPearance HAZY (*)    Hgb urine dipstick LARGE (*)    Protein, ur TRACE (*)    Nitrite POSITIVE (*)    Leukocytes,Ua LARGE (*)    Bacteria, UA MANY (*)    All other components within normal limits  PREGNANCY, URINE    EKG: None  Radiology: DG Knee Complete 4 Views Right Result Date: 08/28/2024 CLINICAL DATA:  right knee pain. EXAM: RIGHT KNEE - COMPLETE 4+ VIEW COMPARISON:  None Available. FINDINGS: No acute fracture or dislocation. No aggressive osseous lesion. There are degenerative changes of the knee joint in the form of mildly reduced medial tibio-femoral compartment joint space, tibial spiking and tricompartmental osteophytosis. No knee effusion or focal soft tissue swelling. No radiopaque foreign bodies. IMPRESSION:  No acute osseous abnormality of the right knee joint. Mild degenerative changes of the right knee joint. Electronically Signed   By: Ree Molt M.D.   On: 08/28/2024 13:15     Procedures   Medications Ordered in the ED  ketorolac  (TORADOL ) 15 MG/ML injection 15 mg (has no administration in time range)  nitrofurantoin  (macrocrystal-monohydrate) (MACROBID ) capsule 100 mg (has no administration in time range)   ED Course  Patient seen and examined. History obtained directly from patient. Work-up including labs, imaging, EKG ordered in triage, if performed, were reviewed.    Labs/EKG:  Independently reviewed and interpreted.  This included: UA, consistent with infection; pregnancy negative.  Imaging: Independently visualized and interpreted.  This included: X-ray of the knee, agree negative for fracture or dislocation  Medications/Fluids: Ordered: IM Toradol  for pain and inflammation of the knee, p.o. Macrobid  for UTI  Most recent vital signs reviewed and are as follows: BP 137/81 (BP Location: Right Arm)   Pulse 85   Temp 97.9 F (36.6 C) (Oral)   Resp 19   SpO2 99%   Initial impression: Dysuria consistent with UTI, acute on chronic right knee pain, negative x-ray, symptoms are not consistent with septic arthritis, gout at this time.  2:39 PM Toradol  given.   Plan: Discharge to home.   Prescriptions written for: Macrobid , meloxicam   Other home care instructions discussed: RICE protocol, avoid other NSAIDs while taking meloxicam   ED return instructions discussed: New or worsening symptoms, inability to walk, fevers, swelling of the knee.  Follow-up instructions discussed: Patient encouraged to follow-up with their PCP in 5 days for reassessment of UTI if not improving, follow-up with orthopedics in 1 week for evaluation of acute on chronic right knee pain.                                    Medical Decision Making Risk Prescription drug management.   Patient with  irritative UTI symptoms consistent with cystitis.  No flank pain or fever to suggest pyelonephritis.  Abdomen is soft and nontender.  Patient be treated with Macrobid .  Also with knee pain, no injury, negative x-ray.  No large effusion today.  Patient is able to range her knee with some discomfort, reports pain with weightbearing as well.  Low concern for septic arthritis, inflammatory arthritis such as gout.  Will continue anti-inflammatories and recommend follow-up with orthopedics.  The patient's vital signs, pertinent lab work and imaging were reviewed and interpreted as discussed in the ED course. Hospitalization was considered for further testing, treatments, or serial exams/observation. However as patient is well-appearing, has a stable exam, and reassuring studies today, I do not feel that they warrant admission at this time. This plan was discussed with the patient who verbalizes agreement and comfort with this plan and seems reliable and able to return to the Emergency Department with worsening or changing symptoms.        Final diagnoses:  Acute cystitis without hematuria  Acute pain of right knee    ED Discharge Orders          Ordered    nitrofurantoin , macrocrystal-monohydrate, (MACROBID ) 100 MG capsule  2 times daily        08/28/24 1402    meloxicam  (MOBIC ) 7.5 MG tablet  Daily        08/28/24 1402               Desiderio Chew, PA-C 08/28/24 1441    Emil Share, DO 08/28/24 1449

## 2024-08-28 NOTE — Discharge Instructions (Signed)
 Please read and follow all provided instructions.  Your diagnoses today include:  1. Acute cystitis without hematuria   2. Acute pain of right knee     Tests performed today include: Urine test - suggests that you have an infection in your bladder X-ray of the right knee showed signs of arthritis, no signs of broken bones Vital signs. See below for your results today.   Medications prescribed:  Macrobid  - antibiotic for urinary tract infection  You have been prescribed an antibiotic medicine: take the entire course of medicine even if you are feeling better. Stopping early can cause the antibiotic not to work.  Meloxicam  - anti-inflammatory pain medication  You have been prescribed an anti-inflammatory medication or NSAID. Take with food. Do not take aspirin, ibuprofen , or naproxen  if taking this medication. Take smallest effective dose for the shortest duration needed for your pain. Stop taking if you experience stomach pain or vomiting.   Home care instructions:  Follow any educational materials contained in this packet.  Follow-up instructions: Please follow-up with your primary care provider in 5 days if symptoms are not resolved for further evaluation of your symptoms.  Follow-up with the orthopedic provider referral for further evaluation of your knee pain.  Return instructions:  Please return to the Emergency Department if you experience worsening symptoms.  Return with fever, worsening pain, persistent vomiting, worsening pain in your back.  Please return if you have any other emergent concerns.  Additional Information:  Your vital signs today were: BP 137/81 (BP Location: Right Arm)   Pulse 85   Temp 97.9 F (36.6 C) (Oral)   Resp 19   SpO2 99%  If your blood pressure (BP) was elevated above 135/85 this visit, please have this repeated by your doctor within one month. --------------

## 2024-08-28 NOTE — ED Triage Notes (Signed)
 Patient states right knee pain for the past 2 weeks. Denies injury. States last year she got shots in her knee due to suspected arthritis. Also reports foul smelling urine and burning during urination for 2 weeks.
